# Patient Record
Sex: Female | Born: 1935 | Race: Black or African American | Hispanic: No | Smoking: Former smoker
Health system: Southern US, Community
[De-identification: ages and names within clinical notes are randomized; demographics above are authoritative.]

## PROBLEM LIST (undated history)

## (undated) DIAGNOSIS — E119 Type 2 diabetes mellitus without complications: Secondary | ICD-10-CM

## (undated) HISTORY — PX: JOINT REPLACEMENT: SHX530

---

## 1997-09-19 ENCOUNTER — Inpatient Hospital Stay (HOSPITAL_COMMUNITY)
Admission: RE | Admit: 1997-09-19 | Discharge: 1997-09-26 | Payer: Self-pay | Admitting: Physical Medicine and Rehabilitation

## 2002-05-27 ENCOUNTER — Ambulatory Visit (HOSPITAL_COMMUNITY): Admission: RE | Admit: 2002-05-27 | Discharge: 2002-05-27 | Payer: Self-pay | Admitting: Internal Medicine

## 2002-07-05 ENCOUNTER — Ambulatory Visit (HOSPITAL_COMMUNITY): Admission: RE | Admit: 2002-07-05 | Discharge: 2002-07-05 | Payer: Self-pay | Admitting: Family Medicine

## 2002-07-05 ENCOUNTER — Encounter: Payer: Self-pay | Admitting: Family Medicine

## 2004-01-23 ENCOUNTER — Ambulatory Visit (HOSPITAL_COMMUNITY): Admission: RE | Admit: 2004-01-23 | Discharge: 2004-01-23 | Payer: Self-pay | Admitting: Ophthalmology

## 2004-02-06 ENCOUNTER — Ambulatory Visit (HOSPITAL_COMMUNITY): Admission: RE | Admit: 2004-02-06 | Discharge: 2004-02-06 | Payer: Self-pay | Admitting: Ophthalmology

## 2007-08-02 ENCOUNTER — Emergency Department (HOSPITAL_COMMUNITY): Admission: EM | Admit: 2007-08-02 | Discharge: 2007-08-02 | Payer: Self-pay | Admitting: Emergency Medicine

## 2007-08-03 ENCOUNTER — Ambulatory Visit: Payer: Self-pay | Admitting: Orthopedic Surgery

## 2007-08-03 ENCOUNTER — Ambulatory Visit (HOSPITAL_COMMUNITY): Admission: RE | Admit: 2007-08-03 | Discharge: 2007-08-03 | Payer: Self-pay | Admitting: Orthopedic Surgery

## 2007-08-03 DIAGNOSIS — IMO0002 Reserved for concepts with insufficient information to code with codable children: Secondary | ICD-10-CM | POA: Insufficient documentation

## 2007-08-03 DIAGNOSIS — M25569 Pain in unspecified knee: Secondary | ICD-10-CM

## 2007-08-17 ENCOUNTER — Ambulatory Visit: Payer: Self-pay | Admitting: Orthopedic Surgery

## 2007-08-24 ENCOUNTER — Encounter: Payer: Self-pay | Admitting: Orthopedic Surgery

## 2008-02-28 ENCOUNTER — Ambulatory Visit: Payer: Self-pay | Admitting: Orthopedic Surgery

## 2008-02-28 DIAGNOSIS — M1710 Unilateral primary osteoarthritis, unspecified knee: Secondary | ICD-10-CM

## 2014-02-22 ENCOUNTER — Encounter (HOSPITAL_COMMUNITY): Payer: Self-pay

## 2014-02-22 ENCOUNTER — Emergency Department (HOSPITAL_COMMUNITY): Payer: Medicare Other

## 2014-02-22 ENCOUNTER — Emergency Department (HOSPITAL_COMMUNITY)
Admission: EM | Admit: 2014-02-22 | Discharge: 2014-02-23 | Disposition: A | Discharge status: Home / Self Care | Payer: Medicare Other | Attending: Emergency Medicine | Admitting: Emergency Medicine

## 2014-02-22 DIAGNOSIS — T148 Other injury of unspecified body region: Secondary | ICD-10-CM | POA: Diagnosis not present

## 2014-02-22 DIAGNOSIS — M25559 Pain in unspecified hip: Secondary | ICD-10-CM | POA: Diagnosis not present

## 2014-02-22 DIAGNOSIS — Z87891 Personal history of nicotine dependence: Secondary | ICD-10-CM | POA: Insufficient documentation

## 2014-02-22 DIAGNOSIS — S40212A Abrasion of left shoulder, initial encounter: Secondary | ICD-10-CM | POA: Diagnosis not present

## 2014-02-22 DIAGNOSIS — S6992XA Unspecified injury of left wrist, hand and finger(s), initial encounter: Secondary | ICD-10-CM | POA: Diagnosis not present

## 2014-02-22 DIAGNOSIS — Y998 Other external cause status: Secondary | ICD-10-CM | POA: Insufficient documentation

## 2014-02-22 DIAGNOSIS — Y9389 Activity, other specified: Secondary | ICD-10-CM | POA: Diagnosis not present

## 2014-02-22 DIAGNOSIS — Y92009 Unspecified place in unspecified non-institutional (private) residence as the place of occurrence of the external cause: Secondary | ICD-10-CM | POA: Diagnosis not present

## 2014-02-22 DIAGNOSIS — M79602 Pain in left arm: Secondary | ICD-10-CM | POA: Diagnosis not present

## 2014-02-22 DIAGNOSIS — S59902A Unspecified injury of left elbow, initial encounter: Secondary | ICD-10-CM | POA: Diagnosis not present

## 2014-02-22 DIAGNOSIS — W19XXXA Unspecified fall, initial encounter: Secondary | ICD-10-CM

## 2014-02-22 DIAGNOSIS — Z23 Encounter for immunization: Secondary | ICD-10-CM | POA: Diagnosis not present

## 2014-02-22 DIAGNOSIS — T148XXA Other injury of unspecified body region, initial encounter: Secondary | ICD-10-CM

## 2014-02-22 DIAGNOSIS — S8992XA Unspecified injury of left lower leg, initial encounter: Secondary | ICD-10-CM | POA: Diagnosis not present

## 2014-02-22 DIAGNOSIS — M25532 Pain in left wrist: Secondary | ICD-10-CM | POA: Diagnosis not present

## 2014-02-22 DIAGNOSIS — W1830XA Fall on same level, unspecified, initial encounter: Secondary | ICD-10-CM | POA: Diagnosis not present

## 2014-02-22 DIAGNOSIS — S8001XA Contusion of right knee, initial encounter: Secondary | ICD-10-CM | POA: Diagnosis not present

## 2014-02-22 DIAGNOSIS — Z880 Allergy status to penicillin: Secondary | ICD-10-CM | POA: Insufficient documentation

## 2014-02-22 DIAGNOSIS — M25512 Pain in left shoulder: Secondary | ICD-10-CM | POA: Diagnosis not present

## 2014-02-22 DIAGNOSIS — M25561 Pain in right knee: Secondary | ICD-10-CM | POA: Diagnosis not present

## 2014-02-22 DIAGNOSIS — S8002XA Contusion of left knee, initial encounter: Secondary | ICD-10-CM | POA: Diagnosis not present

## 2014-02-22 DIAGNOSIS — M25562 Pain in left knee: Secondary | ICD-10-CM | POA: Diagnosis not present

## 2014-02-22 DIAGNOSIS — S8991XA Unspecified injury of right lower leg, initial encounter: Secondary | ICD-10-CM | POA: Insufficient documentation

## 2014-02-22 DIAGNOSIS — M25522 Pain in left elbow: Secondary | ICD-10-CM | POA: Diagnosis not present

## 2014-02-22 DIAGNOSIS — S4992XA Unspecified injury of left shoulder and upper arm, initial encounter: Secondary | ICD-10-CM | POA: Diagnosis present

## 2014-02-22 MED ORDER — TRAMADOL HCL 50 MG PO TABS: 25.0000 mg | ORAL_TABLET | Freq: Four times a day (QID) | ORAL | Status: DC | PRN

## 2014-02-22 MED ORDER — TETANUS-DIPHTH-ACELL PERTUSSIS 5-2.5-18.5 LF-MCG/0.5 IM SUSP
0.5000 mL | Freq: Once | INTRAMUSCULAR | Status: AC
  Administered 2014-02-23: 0.5 mL via INTRAMUSCULAR
  Filled 2014-02-22: qty 0.5

## 2014-02-22 NOTE — Discharge Instructions (Signed)

## 2014-02-22 NOTE — ED Provider Notes (Signed)
CSN: [id number]     Arrival date & time 02/22/14  2106 History   This chart was scribed for Gilda Creasehristopher J. Yaresly Menzel, * by [name], ED Scribe. This patient was seen in room APA16A/APA16A and the patient's care was started at 9:59 PM.    Chief Complaint  Patient presents with  . Fall  . Arm Injury   The history is provided by the patient. No language interpreter was used.   HPI Comments: [name] is a 80 y.o. female who brought to the Emergency Department by EMS complaining of a fall that occurred approximately 9 hours ago. Pt states that she slipped and fell while cleaning, but her left got wedged between her bed and another piece of furniture. Pt states that the slipping was caused by the socks that she was wearing. She is currently complaining of left arm pain and bilateral knee pain. Pt also endorses left elbow pain and wrist pain. She denies any LOC, head trauma, head injury, weakness, numbness, or loss of sensation.   History reviewed. No pertinent past medical history. History reviewed. No pertinent past surgical history. No family history on file. History  Substance Use Topics  . Smoking status: Former Games developermoker  . Smokeless tobacco: Not on file  . Alcohol Use: No   OB History    No data available     Review of Systems  Musculoskeletal: Positive for arthralgias.  Skin: Positive for wound.  All other systems reviewed and are negative.     Allergies  Codeine and Penicillins  Home Medications   Prior to Admission medications   Not on File   Triage Vitals: BP 168/101 mmHg  Pulse 91  Resp 16  SpO2 99%  Physical Exam  Constitutional: She is oriented to person, place, and time. She appears well-developed and well-nourished. No distress.  HENT:  Head: Normocephalic and atraumatic.  Right Ear: Hearing normal.  Left Ear: Hearing normal.  Nose: Nose normal.  Mouth/Throat: Oropharynx is clear and moist and mucous membranes are normal.  Eyes: Conjunctivae and  EOM are normal. Pupils are equal, round, and reactive to light.  Neck: Normal range of motion. Neck supple.  Cardiovascular: Regular rhythm, S1 normal and S2 normal.  Exam reveals no gallop and no friction rub.   No murmur heard. Pulmonary/Chest: Effort normal and breath sounds normal. No respiratory distress. She exhibits no tenderness.  Abdominal: Soft. Normal appearance and bowel sounds are normal. There is no hepatosplenomegaly. There is no tenderness. There is no rebound, no guarding, no tenderness at McBurney's point and negative Murphy's sign. No hernia.  Musculoskeletal: Normal range of motion. She exhibits tenderness.  Tender to palpation of bilateral knees. Tenderness to palpation of left shoulder, left elbow, and left wrist.   Neurological: She is alert and oriented to person, place, and time. She has normal strength. No cranial nerve deficit or sensory deficit. Coordination normal. GCS eye subscore is 4. GCS verbal subscore is 5. GCS motor subscore is 6.  Skin: Skin is warm, dry and intact. No rash noted. No cyanosis.  Big abrasion to anterior aspect of the left shoulder.   Psychiatric: She has a normal mood and affect. Her speech is normal and behavior is normal. Thought content normal.  Nursing note and vitals reviewed.   ED Course  Procedures (including critical care time)  DIAGNOSTIC STUDIES: Oxygen Saturation is 98% on RA, normal by my interpretation.    COORDINATION OF CARE: 10:05 PM- Will order X-rays of left arm. Pt  advised of plan for treatment and pt agrees.  Labs Review Labs Reviewed - No data to display  Imaging Review No results found.   EKG Interpretation None      MDM   Final diagnoses:  None  Abrasion Contusion  Patient complaining of bilateral knee pain and left arm pain after a ground-level fall. X-rays are negative. She does have an abrasion on the anterior portion of her shoulder. Family was counseled on wound care and watching for signs of  infection.  Family reports that the left leg has been more swollen than the right for some time. She "does not see doctors often". Patient will reschedule for venous duplex of the leg tomorrow to ensure that there is no DVT. She was provided analgesia.  I personally performed the services described in this documentation, which was scribed in my presence. The recorded information has been reviewed and is accurate.    Gilda Creasehristopher J. Amalea Ottey, MD 02/22/14 2351

## 2014-02-22 NOTE — ED Notes (Signed)
Patient used bedpan not able to save urine due to stool also being present. Patient has a lot of edema in left ankle and calf area. Also patient has several abrasions on her left shoulder. Patient has a place on left outer thigh that looks like a water blister.

## 2014-02-22 NOTE — ED Notes (Signed)
Pt reports to ems that she fell at home approx 12 noon, states she slipped on her slippery socks and then got her arm wedged between the bed and another piece of furniture.  Pt c/o pain to left arm and behind left knee.

## 2014-02-22 NOTE — ED Notes (Addendum)
Abrasion noted to left shoulder, EMS reports that pt lives alone at home

## 2014-02-23 ENCOUNTER — Other Ambulatory Visit (HOSPITAL_COMMUNITY): Payer: Self-pay | Admitting: Emergency Medicine

## 2014-02-23 ENCOUNTER — Ambulatory Visit (HOSPITAL_COMMUNITY)
Admission: RE | Admit: 2014-02-23 | Discharge: 2014-02-23 | Disposition: A | Discharge status: Home / Self Care | Payer: Medicare Other | Source: Ambulatory Visit | Attending: Emergency Medicine | Admitting: Emergency Medicine

## 2014-02-23 DIAGNOSIS — M7989 Other specified soft tissue disorders: Secondary | ICD-10-CM

## 2014-02-23 NOTE — ED Notes (Signed)
Pt left ED via wheelchair by private vehicle with son. No signs of distress. Pt's son and pt verbalizes discharge instructions.

## 2014-02-23 NOTE — ED Notes (Signed)
Family at bedside. Patient taken off bedpan urinated again. Patient peri care done at bedside. Patient able to turn to assist.

## 2014-02-27 DIAGNOSIS — M810 Age-related osteoporosis without current pathological fracture: Secondary | ICD-10-CM | POA: Diagnosis not present

## 2014-02-27 DIAGNOSIS — R296 Repeated falls: Secondary | ICD-10-CM | POA: Diagnosis not present

## 2014-02-27 DIAGNOSIS — E559 Vitamin D deficiency, unspecified: Secondary | ICD-10-CM | POA: Diagnosis not present

## 2014-02-27 DIAGNOSIS — E785 Hyperlipidemia, unspecified: Secondary | ICD-10-CM | POA: Diagnosis not present

## 2014-02-27 DIAGNOSIS — R7301 Impaired fasting glucose: Secondary | ICD-10-CM | POA: Diagnosis not present

## 2014-03-03 DIAGNOSIS — M810 Age-related osteoporosis without current pathological fracture: Secondary | ICD-10-CM | POA: Diagnosis not present

## 2014-03-03 DIAGNOSIS — R7301 Impaired fasting glucose: Secondary | ICD-10-CM | POA: Diagnosis not present

## 2014-03-03 DIAGNOSIS — M179 Osteoarthritis of knee, unspecified: Secondary | ICD-10-CM | POA: Diagnosis not present

## 2014-03-03 DIAGNOSIS — R296 Repeated falls: Secondary | ICD-10-CM | POA: Diagnosis not present

## 2014-03-06 DIAGNOSIS — M810 Age-related osteoporosis without current pathological fracture: Secondary | ICD-10-CM | POA: Diagnosis not present

## 2014-03-06 DIAGNOSIS — M179 Osteoarthritis of knee, unspecified: Secondary | ICD-10-CM | POA: Diagnosis not present

## 2014-03-07 DIAGNOSIS — M810 Age-related osteoporosis without current pathological fracture: Secondary | ICD-10-CM | POA: Diagnosis not present

## 2014-03-07 DIAGNOSIS — M179 Osteoarthritis of knee, unspecified: Secondary | ICD-10-CM | POA: Diagnosis not present

## 2014-03-15 DIAGNOSIS — M810 Age-related osteoporosis without current pathological fracture: Secondary | ICD-10-CM | POA: Diagnosis not present

## 2014-03-15 DIAGNOSIS — M179 Osteoarthritis of knee, unspecified: Secondary | ICD-10-CM | POA: Diagnosis not present

## 2014-03-21 DIAGNOSIS — M810 Age-related osteoporosis without current pathological fracture: Secondary | ICD-10-CM | POA: Diagnosis not present

## 2014-03-21 DIAGNOSIS — M179 Osteoarthritis of knee, unspecified: Secondary | ICD-10-CM | POA: Diagnosis not present

## 2014-03-24 DIAGNOSIS — M810 Age-related osteoporosis without current pathological fracture: Secondary | ICD-10-CM | POA: Diagnosis not present

## 2014-03-24 DIAGNOSIS — M179 Osteoarthritis of knee, unspecified: Secondary | ICD-10-CM | POA: Diagnosis not present

## 2014-03-29 DIAGNOSIS — M179 Osteoarthritis of knee, unspecified: Secondary | ICD-10-CM | POA: Diagnosis not present

## 2014-03-29 DIAGNOSIS — M810 Age-related osteoporosis without current pathological fracture: Secondary | ICD-10-CM | POA: Diagnosis not present

## 2014-04-04 DIAGNOSIS — M810 Age-related osteoporosis without current pathological fracture: Secondary | ICD-10-CM | POA: Diagnosis not present

## 2014-04-04 DIAGNOSIS — M179 Osteoarthritis of knee, unspecified: Secondary | ICD-10-CM | POA: Diagnosis not present

## 2014-05-22 ENCOUNTER — Emergency Department (HOSPITAL_COMMUNITY): Payer: Medicare Other

## 2014-05-22 ENCOUNTER — Inpatient Hospital Stay (HOSPITAL_COMMUNITY)
Admission: EM | Admit: 2014-05-22 | Discharge: 2014-05-27 | DRG: 481 | Disposition: A | Discharge status: Skilled Nursing Facility | Payer: Medicare Other | Attending: Internal Medicine | Admitting: Internal Medicine

## 2014-05-22 ENCOUNTER — Encounter (HOSPITAL_COMMUNITY): Payer: Self-pay | Admitting: Emergency Medicine

## 2014-05-22 DIAGNOSIS — R7309 Other abnormal glucose: Secondary | ICD-10-CM | POA: Diagnosis present

## 2014-05-22 DIAGNOSIS — T148XXA Other injury of unspecified body region, initial encounter: Secondary | ICD-10-CM

## 2014-05-22 DIAGNOSIS — Z09 Encounter for follow-up examination after completed treatment for conditions other than malignant neoplasm: Secondary | ICD-10-CM

## 2014-05-22 DIAGNOSIS — S7292XA Unspecified fracture of left femur, initial encounter for closed fracture: Secondary | ICD-10-CM

## 2014-05-22 DIAGNOSIS — M79605 Pain in left leg: Secondary | ICD-10-CM | POA: Diagnosis not present

## 2014-05-22 DIAGNOSIS — S72402A Unspecified fracture of lower end of left femur, initial encounter for closed fracture: Secondary | ICD-10-CM | POA: Diagnosis not present

## 2014-05-22 DIAGNOSIS — W19XXXA Unspecified fall, initial encounter: Secondary | ICD-10-CM

## 2014-05-22 DIAGNOSIS — M978XXA Periprosthetic fracture around other internal prosthetic joint, initial encounter: Secondary | ICD-10-CM

## 2014-05-22 DIAGNOSIS — R627 Adult failure to thrive: Secondary | ICD-10-CM | POA: Diagnosis present

## 2014-05-22 DIAGNOSIS — N390 Urinary tract infection, site not specified: Secondary | ICD-10-CM | POA: Diagnosis not present

## 2014-05-22 DIAGNOSIS — Z419 Encounter for procedure for purposes other than remedying health state, unspecified: Secondary | ICD-10-CM

## 2014-05-22 DIAGNOSIS — T84041A Periprosthetic fracture around internal prosthetic left hip joint, initial encounter: Principal | ICD-10-CM | POA: Diagnosis present

## 2014-05-22 DIAGNOSIS — F0390 Unspecified dementia without behavioral disturbance: Secondary | ICD-10-CM | POA: Diagnosis not present

## 2014-05-22 DIAGNOSIS — M25552 Pain in left hip: Secondary | ICD-10-CM | POA: Diagnosis not present

## 2014-05-22 DIAGNOSIS — Z96641 Presence of right artificial hip joint: Secondary | ICD-10-CM | POA: Diagnosis not present

## 2014-05-22 DIAGNOSIS — Z01811 Encounter for preprocedural respiratory examination: Secondary | ICD-10-CM

## 2014-05-22 DIAGNOSIS — I959 Hypotension, unspecified: Secondary | ICD-10-CM | POA: Diagnosis not present

## 2014-05-22 DIAGNOSIS — R000 Tachycardia, unspecified: Secondary | ICD-10-CM | POA: Diagnosis not present

## 2014-05-22 DIAGNOSIS — S79912A Unspecified injury of left hip, initial encounter: Secondary | ICD-10-CM | POA: Diagnosis not present

## 2014-05-22 DIAGNOSIS — Y92009 Unspecified place in unspecified non-institutional (private) residence as the place of occurrence of the external cause: Secondary | ICD-10-CM

## 2014-05-22 DIAGNOSIS — Z87891 Personal history of nicotine dependence: Secondary | ICD-10-CM

## 2014-05-22 DIAGNOSIS — D62 Acute posthemorrhagic anemia: Secondary | ICD-10-CM | POA: Diagnosis not present

## 2014-05-22 DIAGNOSIS — S72492A Other fracture of lower end of left femur, initial encounter for closed fracture: Secondary | ICD-10-CM | POA: Diagnosis not present

## 2014-05-22 DIAGNOSIS — W010XXA Fall on same level from slipping, tripping and stumbling without subsequent striking against object, initial encounter: Secondary | ICD-10-CM | POA: Diagnosis present

## 2014-05-22 DIAGNOSIS — Z96649 Presence of unspecified artificial hip joint: Secondary | ICD-10-CM

## 2014-05-22 DIAGNOSIS — J90 Pleural effusion, not elsewhere classified: Secondary | ICD-10-CM | POA: Diagnosis not present

## 2014-05-22 DIAGNOSIS — R52 Pain, unspecified: Secondary | ICD-10-CM | POA: Diagnosis not present

## 2014-05-22 DIAGNOSIS — R011 Cardiac murmur, unspecified: Secondary | ICD-10-CM | POA: Insufficient documentation

## 2014-05-22 DIAGNOSIS — Z96642 Presence of left artificial hip joint: Secondary | ICD-10-CM | POA: Diagnosis not present

## 2014-05-22 LAB — CBC WITH DIFFERENTIAL/PLATELET
BASOS ABS: 0 10*3/uL (ref 0.0–0.1)
BASOS PCT: 0 % (ref 0–1)
EOS PCT: 0 % (ref 0–5)
Eosinophils Absolute: 0 10*3/uL (ref 0.0–0.7)
HCT: 36.3 % (ref 36.0–46.0)
Hemoglobin: 12.2 g/dL (ref 12.0–15.0)
LYMPHS PCT: 10 % — AB (ref 12–46)
Lymphs Abs: 1.1 10*3/uL (ref 0.7–4.0)
MCH: 30.6 pg (ref 26.0–34.0)
MCHC: 33.6 g/dL (ref 30.0–36.0)
MCV: 91 fL (ref 78.0–100.0)
MONO ABS: 1.2 10*3/uL — AB (ref 0.1–1.0)
Monocytes Relative: 11 % (ref 3–12)
NEUTROS ABS: 8.1 10*3/uL — AB (ref 1.7–7.7)
Neutrophils Relative %: 79 % — ABNORMAL HIGH (ref 43–77)
PLATELETS: 206 10*3/uL (ref 150–400)
RBC: 3.99 MIL/uL (ref 3.87–5.11)
RDW: 13.9 % (ref 11.5–15.5)
WBC: 10.4 10*3/uL (ref 4.0–10.5)

## 2014-05-22 LAB — BASIC METABOLIC PANEL
ANION GAP: 13 (ref 5–15)
BUN: 24 mg/dL — ABNORMAL HIGH (ref 6–23)
CHLORIDE: 102 mmol/L (ref 96–112)
CO2: 22 mmol/L (ref 19–32)
Calcium: 9.5 mg/dL (ref 8.4–10.5)
Creatinine, Ser: 1.08 mg/dL (ref 0.50–1.10)
GFR calc non Af Amer: 45 mL/min — ABNORMAL LOW (ref >90)
GFR, EST AFRICAN AMERICAN: 52 mL/min — AB (ref >90)
Glucose, Bld: 111 mg/dL — ABNORMAL HIGH (ref 70–99)
POTASSIUM: 3.6 mmol/L (ref 3.5–5.1)
SODIUM: 137 mmol/L (ref 135–145)

## 2014-05-22 NOTE — ED Notes (Signed)
Pt transported to xray 

## 2014-05-22 NOTE — ED Notes (Signed)
Pt tripped at home and fell onto left leg. Pt c/o of pain in upper left leg, leg is internally rotated. Ems gave 5mg  of morphine and 4mg  of zofran.

## 2014-05-22 NOTE — ED Provider Notes (Addendum)
CSN: [id number]     Arrival date & time 05/22/14  1930 History   First MD Initiated Contact with Patient 05/22/14 1953     Chief Complaint  Patient presents with  . Fall     (Consider location/radiation/quality/duration/timing/severity/associated sxs/prior Treatment) Patient is a 81 y.o. female presenting with fall. The history is provided by the patient.  Fall  She was walking from her bedroom to the kitchen and states that her left leg went out on her and she fell. She's not been able to get up since then. She is complaining of pain which he rates at 10/10. She is status post hip surgery in that leg approximately 17 years ago. She denies other injury.  History reviewed. No pertinent past medical history. Past Surgical History  Procedure Laterality Date  . Joint replacement     No family history on file. History  Substance Use Topics  . Smoking status: Former Games developer  . Smokeless tobacco: Not on file  . Alcohol Use: No   OB History    No data available     Review of Systems  All other systems reviewed and are negative.     Allergies  Codeine and Penicillins  Home Medications   Prior to Admission medications   Medication Sig Start Date End Date Taking? Authorizing Provider  acetaminophen (TYLENOL) 500 MG tablet Take 500 mg by mouth every 6 (six) hours as needed for mild pain.   Yes Historical Provider, MD  Misc Natural Products (FLEX-A-MIN JOINT FLEX) TABS Take 1 tablet by mouth daily.   Yes Historical Provider, MD  Turmeric Curcumin 500 MG CAPS Take 1 capsule by mouth daily as needed (for pain).   Yes Historical Provider, MD  traMADol (ULTRAM) 50 MG tablet Take 0.5 tablets (25 mg total) by mouth every 6 (six) hours as needed. Patient not taking: Reported on 05/22/2014 02/22/14   Gilda Crease, MD   BP 142/78 mmHg  Pulse 103  Temp(Src) 97.6 F (36.4 C) (Oral)  Resp 16  Ht  (1.6 m)  Wt 140 lb (63.504 kg)  BMI 24.81 kg/m2  SpO2 100% Physical Exam   Nursing note and vitals reviewed.  82 year old female, resting comfortably and in no acute distress. Vital signs are significant for mild hypertension and mild tachycardia. Oxygen saturation is 100%, which is normal. Head is normocephalic and atraumatic. PERRLA, EOMI. Oropharynx is clear. Neck is nontender and supple without adenopathy or JVD. Back is nontender and there is no CVA tenderness. Lungs are clear without rales, wheezes, or rhonchi. Chest is nontender. Heart has regular rate and rhythm without murmur. Abdomen is soft, flat, nontender without masses or hepatosplenomegaly and peristalsis is normoactive. Extremities: Left leg is shortened and internally rotated consistent with hip dislocation. Dorsalis pedis pulse is strong and capillary refill is prompt and sensation is normal.. Skin is warm and dry without rash. Neurologic: Mental status is normal, cranial nerves are intact, there are no motor or sensory deficits.  ED Course  Procedures (including critical care time) Labs Review Results for orders placed or performed during the hospital encounter of 05/22/14  Basic metabolic panel  Result Value Ref Range   Sodium 137 135 - 145 mmol/L   Potassium 3.6 3.5 - 5.1 mmol/L   Chloride 102 96 - 112 mmol/L   CO2 22 19 - 32 mmol/L   Glucose, Bld 111 (H) 70 - 99 mg/dL   BUN 24 (H) 6 - 23 mg/dL   Creatinine, Ser 7.82  0.50 - 1.10 mg/dL   Calcium 9.5 8.4 - 63.8 mg/dL   GFR calc non Af Amer 45 (L) >90 mL/min   GFR calc Af Amer 52 (L) >90 mL/min   Anion gap 13 5 - 15  CBC with Differential  Result Value Ref Range   WBC 10.4 4.0 - 10.5 K/uL   RBC 3.99 3.87 - 5.11 MIL/uL   Hemoglobin 12.2 12.0 - 15.0 g/dL   HCT 75.6 43.3 - 29.5 %   MCV 91.0 78.0 - 100.0 fL   MCH 30.6 26.0 - 34.0 pg   MCHC 33.6 30.0 - 36.0 g/dL   RDW 18.8 41.6 - 60.6 %   Platelets 206 150 - 400 K/uL   Neutrophils Relative % 79 (H) 43 - 77 %   Neutro Abs 8.1 (H) 1.7 - 7.7 K/uL   Lymphocytes Relative 10 (L) 12 - 46  %   Lymphs Abs 1.1 0.7 - 4.0 K/uL   Monocytes Relative 11 3 - 12 %   Monocytes Absolute 1.2 (H) 0.1 - 1.0 K/uL   Eosinophils Relative 0 0 - 5 %   Eosinophils Absolute 0.0 0.0 - 0.7 K/uL   Basophils Relative 0 0 - 1 %   Basophils Absolute 0.0 0.0 - 0.1 K/uL   Imaging Review Dg Chest 1 View  05/22/2014   CLINICAL DATA:  Status post fall.  Left hip pain.  EXAM: CHEST  1 VIEW  COMPARISON:  None.  FINDINGS: There is elevation of the right hemidiaphragm relative to the left. The patient is rotated on the study. Lungs appear clear. Heart size is normal. No pneumothorax or pleural effusion. Advanced degenerative disease right shoulder is noted.  IMPRESSION: No acute cardiopulmonary disease.   Electronically Signed   By: Drusilla Kanner M.D.   On: 05/22/2014 20:42   Dg Hip Unilat With Pelvis 2-3 Views Left  05/22/2014   CLINICAL DATA:  Status post fall with pain all lower left hip.  EXAM: LEFT HIP (WITH PELVIS) 2-3 VIEWS  COMPARISON:  None.  FINDINGS: A left hip replacement is identified. There is no dislocation. There is no acute fracture or dislocation. There are severe it degenerative joint changes of right hip.  IMPRESSION: Left hip replacement without dislocation. No acute fracture or dislocation is identified.   Electronically Signed   By: Sherian Rein M.D.   On: 05/22/2014 20:42   Images viewed by me.   EKG Interpretation   Date/Time:  Monday May 22 2014 20:51:49 EST Ventricular Rate:  106 PR Interval:  180 QRS Duration: 52 QT Interval:  349 QTC Calculation: 463 R Axis:   41 Text Interpretation:  Sinus tachycardia LAE, consider biatrial enlargement  Artifact in lead(s) I II III aVR aVL aVF V2 V5 and baseline wander in  lead(s) II III aVR aVL aVF V1 V2 V3 V4 V5 V6 No old tracing to compare  Confirmed by [facility]  MD, Deniz Hannan ([id number]) on 05/22/2014 9:07:20 PM      MDM   Final diagnoses:  Fall at home  Fall at home, initial encounter  Closed fracture of left femur, initial encounter     Fall with probable left hip dislocation. X-rays ordered.  X-rays do not show evidence of hip dislocation. Patient was examined in greater detail and she is tender in the region of her left knee and distal left thigh. I am asked she able to put her knee and hip through a full range of motion although it does seem to be painful.  Femur x-ray and the x-ray have been ordered.  Femur x-ray shows oblique fracture of the distal femur without any intra-articular component. Cases been discussed with Dr. Shon BatonBrooks of orthopedics who requests medicine admit the patient and he will arrange for appropriate surgical management. Case is discussed with Dr. Welton FlakesKhan of [facility] who agrees to admit the patient.  Dione Boozeavid Chante Mayson, MD 05/23/14 0006  Dione Boozeavid Ainsleigh Kakos, MD 05/23/14 [id number]

## 2014-05-22 NOTE — ED Notes (Signed)
Family at bedside. 

## 2014-05-23 ENCOUNTER — Inpatient Hospital Stay (HOSPITAL_COMMUNITY): Payer: Medicare Other | Admitting: Certified Registered"

## 2014-05-23 ENCOUNTER — Inpatient Hospital Stay (HOSPITAL_COMMUNITY): Payer: Medicare Other

## 2014-05-23 ENCOUNTER — Encounter (HOSPITAL_COMMUNITY)
Admission: EM | Disposition: A | Discharge status: Skilled Nursing Facility | Payer: Self-pay | Source: Home / Self Care | Attending: Internal Medicine

## 2014-05-23 DIAGNOSIS — S7292XD Unspecified fracture of left femur, subsequent encounter for closed fracture with routine healing: Secondary | ICD-10-CM | POA: Diagnosis not present

## 2014-05-23 DIAGNOSIS — M79605 Pain in left leg: Secondary | ICD-10-CM | POA: Diagnosis not present

## 2014-05-23 DIAGNOSIS — Y92009 Unspecified place in unspecified non-institutional (private) residence as the place of occurrence of the external cause: Secondary | ICD-10-CM

## 2014-05-23 DIAGNOSIS — Z87891 Personal history of nicotine dependence: Secondary | ICD-10-CM | POA: Diagnosis not present

## 2014-05-23 DIAGNOSIS — S72402D Unspecified fracture of lower end of left femur, subsequent encounter for closed fracture with routine healing: Secondary | ICD-10-CM | POA: Diagnosis not present

## 2014-05-23 DIAGNOSIS — R488 Other symbolic dysfunctions: Secondary | ICD-10-CM | POA: Diagnosis not present

## 2014-05-23 DIAGNOSIS — R279 Unspecified lack of coordination: Secondary | ICD-10-CM | POA: Diagnosis not present

## 2014-05-23 DIAGNOSIS — W19XXXD Unspecified fall, subsequent encounter: Secondary | ICD-10-CM | POA: Diagnosis not present

## 2014-05-23 DIAGNOSIS — S7292XA Unspecified fracture of left femur, initial encounter for closed fracture: Secondary | ICD-10-CM

## 2014-05-23 DIAGNOSIS — S72492D Other fracture of lower end of left femur, subsequent encounter for closed fracture with routine healing: Secondary | ICD-10-CM | POA: Diagnosis not present

## 2014-05-23 DIAGNOSIS — T84043D Periprosthetic fracture around internal prosthetic left knee joint, subsequent encounter: Secondary | ICD-10-CM | POA: Diagnosis not present

## 2014-05-23 DIAGNOSIS — R531 Weakness: Secondary | ICD-10-CM | POA: Diagnosis not present

## 2014-05-23 DIAGNOSIS — R010 Benign and innocent cardiac murmurs: Secondary | ICD-10-CM | POA: Diagnosis not present

## 2014-05-23 DIAGNOSIS — T84041A Periprosthetic fracture around internal prosthetic left hip joint, initial encounter: Secondary | ICD-10-CM | POA: Diagnosis not present

## 2014-05-23 DIAGNOSIS — Y92099 Unspecified place in other non-institutional residence as the place of occurrence of the external cause: Secondary | ICD-10-CM | POA: Diagnosis not present

## 2014-05-23 DIAGNOSIS — M0549 Rheumatoid myopathy with rheumatoid arthritis of multiple sites: Secondary | ICD-10-CM | POA: Diagnosis not present

## 2014-05-23 DIAGNOSIS — Z96642 Presence of left artificial hip joint: Secondary | ICD-10-CM | POA: Diagnosis not present

## 2014-05-23 DIAGNOSIS — S72422A Displaced fracture of lateral condyle of left femur, initial encounter for closed fracture: Secondary | ICD-10-CM | POA: Diagnosis not present

## 2014-05-23 DIAGNOSIS — D62 Acute posthemorrhagic anemia: Secondary | ICD-10-CM | POA: Diagnosis not present

## 2014-05-23 DIAGNOSIS — R000 Tachycardia, unspecified: Secondary | ICD-10-CM | POA: Diagnosis not present

## 2014-05-23 DIAGNOSIS — R011 Cardiac murmur, unspecified: Secondary | ICD-10-CM | POA: Insufficient documentation

## 2014-05-23 DIAGNOSIS — W010XXA Fall on same level from slipping, tripping and stumbling without subsequent striking against object, initial encounter: Secondary | ICD-10-CM | POA: Diagnosis present

## 2014-05-23 DIAGNOSIS — T84048A Periprosthetic fracture around other internal prosthetic joint, initial encounter: Secondary | ICD-10-CM | POA: Diagnosis not present

## 2014-05-23 DIAGNOSIS — T84011A Broken internal left hip prosthesis, initial encounter: Secondary | ICD-10-CM | POA: Diagnosis not present

## 2014-05-23 DIAGNOSIS — F0390 Unspecified dementia without behavioral disturbance: Secondary | ICD-10-CM | POA: Diagnosis present

## 2014-05-23 DIAGNOSIS — Z9181 History of falling: Secondary | ICD-10-CM | POA: Diagnosis not present

## 2014-05-23 DIAGNOSIS — M978XXA Periprosthetic fracture around other internal prosthetic joint, initial encounter: Secondary | ICD-10-CM

## 2014-05-23 DIAGNOSIS — R627 Adult failure to thrive: Secondary | ICD-10-CM | POA: Diagnosis present

## 2014-05-23 DIAGNOSIS — S7290XA Unspecified fracture of unspecified femur, initial encounter for closed fracture: Secondary | ICD-10-CM | POA: Diagnosis not present

## 2014-05-23 DIAGNOSIS — W19XXXA Unspecified fall, initial encounter: Secondary | ICD-10-CM | POA: Insufficient documentation

## 2014-05-23 DIAGNOSIS — M1990 Unspecified osteoarthritis, unspecified site: Secondary | ICD-10-CM | POA: Diagnosis not present

## 2014-05-23 DIAGNOSIS — I959 Hypotension, unspecified: Secondary | ICD-10-CM | POA: Diagnosis not present

## 2014-05-23 DIAGNOSIS — R7309 Other abnormal glucose: Secondary | ICD-10-CM | POA: Diagnosis present

## 2014-05-23 DIAGNOSIS — Z96641 Presence of right artificial hip joint: Secondary | ICD-10-CM | POA: Diagnosis not present

## 2014-05-23 DIAGNOSIS — R1312 Dysphagia, oropharyngeal phase: Secondary | ICD-10-CM | POA: Diagnosis not present

## 2014-05-23 DIAGNOSIS — N390 Urinary tract infection, site not specified: Secondary | ICD-10-CM | POA: Diagnosis present

## 2014-05-23 DIAGNOSIS — Z96649 Presence of unspecified artificial hip joint: Secondary | ICD-10-CM

## 2014-05-23 DIAGNOSIS — M6281 Muscle weakness (generalized): Secondary | ICD-10-CM | POA: Diagnosis not present

## 2014-05-23 HISTORY — PX: ORIF PERIPROSTHETIC FRACTURE: SHX5034

## 2014-05-23 LAB — URINALYSIS, ROUTINE W REFLEX MICROSCOPIC
Bilirubin Urine: NEGATIVE
GLUCOSE, UA: NEGATIVE mg/dL
Ketones, ur: 15 mg/dL — AB
Nitrite: NEGATIVE
Protein, ur: NEGATIVE mg/dL
SPECIFIC GRAVITY, URINE: 1.019 (ref 1.005–1.030)
Urobilinogen, UA: 1 mg/dL (ref 0.0–1.0)
pH: 5 (ref 5.0–8.0)

## 2014-05-23 LAB — URINE MICROSCOPIC-ADD ON

## 2014-05-23 LAB — GLUCOSE, CAPILLARY
GLUCOSE-CAPILLARY: 115 mg/dL — AB (ref 70–99)
GLUCOSE-CAPILLARY: 109 mg/dL — AB (ref 70–99)
Glucose-Capillary: 88 mg/dL (ref 70–99)
Glucose-Capillary: 107 mg/dL — ABNORMAL HIGH (ref 70–99)
Glucose-Capillary: 146 mg/dL — ABNORMAL HIGH (ref 70–99)

## 2014-05-23 LAB — CBC
HEMATOCRIT: 26.4 % — AB (ref 36.0–46.0)
Hemoglobin: 8.7 g/dL — ABNORMAL LOW (ref 12.0–15.0)
MCH: 30.1 pg (ref 26.0–34.0)
MCHC: 33 g/dL (ref 30.0–36.0)
MCV: 91.3 fL (ref 78.0–100.0)
Platelets: 156 10*3/uL (ref 150–400)
RBC: 2.89 MIL/uL — ABNORMAL LOW (ref 3.87–5.11)
RDW: 14.1 % (ref 11.5–15.5)
WBC: 8 10*3/uL (ref 4.0–10.5)

## 2014-05-23 LAB — ABO/RH: ABO/RH(D): O POS

## 2014-05-23 LAB — SURGICAL PCR SCREEN
MRSA, PCR: NEGATIVE
Staphylococcus aureus: NEGATIVE

## 2014-05-23 LAB — CREATININE, SERUM
Creatinine, Ser: 0.71 mg/dL (ref 0.50–1.10)
GFR calc non Af Amer: 76 mL/min — ABNORMAL LOW (ref >90)
GFR, EST AFRICAN AMERICAN: 88 mL/min — AB (ref >90)

## 2014-05-23 SURGERY — OPEN REDUCTION INTERNAL FIXATION (ORIF) PERIPROSTHETIC FRACTURE
Anesthesia: General | Site: Hip | Laterality: Left

## 2014-05-23 MED ORDER — TRAMADOL HCL 50 MG PO TABS
25.0000 mg | ORAL_TABLET | Freq: Four times a day (QID) | ORAL | Status: DC | PRN
  Administered 2014-05-24 – 2014-05-25 (×2): 25 mg via ORAL
  Filled 2014-05-23 (×3): qty 1

## 2014-05-23 MED ORDER — PROPOFOL 10 MG/ML IV BOLUS
INTRAVENOUS | Status: AC
  Filled 2014-05-23: qty 20

## 2014-05-23 MED ORDER — METOCLOPRAMIDE HCL 5 MG/ML IJ SOLN: 5.0000 mg | Freq: Three times a day (TID) | INTRAMUSCULAR | Status: DC | PRN

## 2014-05-23 MED ORDER — MORPHINE SULFATE 2 MG/ML IJ SOLN
0.5000 mg | INTRAMUSCULAR | Status: DC | PRN
  Administered 2014-05-23 (×2): 0.5 mg via INTRAVENOUS
  Filled 2014-05-23 (×2): qty 1

## 2014-05-23 MED ORDER — ONDANSETRON HCL 4 MG/2ML IJ SOLN: 4.0000 mg | Freq: Four times a day (QID) | INTRAMUSCULAR | Status: DC | PRN

## 2014-05-23 MED ORDER — PHENYLEPHRINE HCL 10 MG/ML IJ SOLN
10.0000 mg | INTRAVENOUS | Status: DC | PRN
  Administered 2014-05-23: 10 ug/min via INTRAVENOUS

## 2014-05-23 MED ORDER — LACTATED RINGERS IV SOLN
INTRAVENOUS | Status: DC | PRN
  Administered 2014-05-23 (×2): via INTRAVENOUS

## 2014-05-23 MED ORDER — MAGNESIUM CITRATE PO SOLN: 1.0000 | Freq: Once | ORAL | Status: AC | PRN

## 2014-05-23 MED ORDER — DEXTROSE 5 % IV SOLN
500.0000 mg | Freq: Four times a day (QID) | INTRAVENOUS | Status: DC | PRN
  Filled 2014-05-23: qty 5

## 2014-05-23 MED ORDER — ONDANSETRON HCL 4 MG/2ML IJ SOLN
INTRAMUSCULAR | Status: DC | PRN
  Administered 2014-05-23: 4 mg via INTRAVENOUS

## 2014-05-23 MED ORDER — METHOCARBAMOL 500 MG PO TABS
500.0000 mg | ORAL_TABLET | Freq: Four times a day (QID) | ORAL | Status: DC | PRN
  Administered 2014-05-24: 500 mg via ORAL
  Filled 2014-05-23: qty 1

## 2014-05-23 MED ORDER — ACETAMINOPHEN 500 MG PO TABS
1000.0000 mg | ORAL_TABLET | Freq: Three times a day (TID) | ORAL | Status: DC
  Administered 2014-05-23 – 2014-05-27 (×12): 1000 mg via ORAL
  Filled 2014-05-23 (×12): qty 2

## 2014-05-23 MED ORDER — SUCCINYLCHOLINE CHLORIDE 20 MG/ML IJ SOLN
INTRAMUSCULAR | Status: AC
  Filled 2014-05-23: qty 1

## 2014-05-23 MED ORDER — DOCUSATE SODIUM 100 MG PO CAPS
100.0000 mg | ORAL_CAPSULE | Freq: Two times a day (BID) | ORAL | Status: DC
  Administered 2014-05-23 – 2014-05-27 (×9): 100 mg via ORAL
  Filled 2014-05-23 (×9): qty 1

## 2014-05-23 MED ORDER — ROCURONIUM BROMIDE 50 MG/5ML IV SOLN
INTRAVENOUS | Status: AC
  Filled 2014-05-23: qty 1

## 2014-05-23 MED ORDER — SODIUM CHLORIDE 0.9 % IV BOLUS (SEPSIS)
500.0000 mL | Freq: Once | INTRAVENOUS | Status: AC
  Administered 2014-05-23: 500 mL via INTRAVENOUS

## 2014-05-23 MED ORDER — PHENYLEPHRINE 40 MCG/ML (10ML) SYRINGE FOR IV PUSH (FOR BLOOD PRESSURE SUPPORT)
PREFILLED_SYRINGE | INTRAVENOUS | Status: AC
  Filled 2014-05-23: qty 10

## 2014-05-23 MED ORDER — ONDANSETRON HCL 4 MG/2ML IJ SOLN
4.0000 mg | Freq: Four times a day (QID) | INTRAMUSCULAR | Status: DC | PRN
  Administered 2014-05-23: 4 mg via INTRAVENOUS
  Filled 2014-05-23: qty 2

## 2014-05-23 MED ORDER — ENOXAPARIN SODIUM 40 MG/0.4ML ~~LOC~~ SOLN
40.0000 mg | SUBCUTANEOUS | Status: DC
  Administered 2014-05-24 – 2014-05-27 (×4): 40 mg via SUBCUTANEOUS
  Filled 2014-05-23 (×4): qty 0.4

## 2014-05-23 MED ORDER — MENTHOL 3 MG MT LOZG
1.0000 | LOZENGE | OROMUCOSAL | Status: DC | PRN
  Filled 2014-05-23 (×3): qty 9

## 2014-05-23 MED ORDER — LIDOCAINE HCL (CARDIAC) 20 MG/ML IV SOLN
INTRAVENOUS | Status: AC
  Filled 2014-05-23: qty 5

## 2014-05-23 MED ORDER — BISACODYL 10 MG RE SUPP
10.0000 mg | Freq: Every day | RECTAL | Status: DC | PRN
  Administered 2014-05-26: 10 mg via RECTAL
  Filled 2014-05-23: qty 1

## 2014-05-23 MED ORDER — FENTANYL CITRATE 0.05 MG/ML IJ SOLN: 25.0000 ug | INTRAMUSCULAR | Status: DC | PRN

## 2014-05-23 MED ORDER — NEOSTIGMINE METHYLSULFATE 10 MG/10ML IV SOLN
INTRAVENOUS | Status: DC | PRN
  Administered 2014-05-23: 4 mg via INTRAVENOUS

## 2014-05-23 MED ORDER — ALBUMIN HUMAN 5 % IV SOLN
INTRAVENOUS | Status: DC | PRN
  Administered 2014-05-23 (×2): via INTRAVENOUS

## 2014-05-23 MED ORDER — ACETAMINOPHEN 650 MG RE SUPP
650.0000 mg | Freq: Three times a day (TID) | RECTAL | Status: DC
Start: 2014-05-23 — End: 2014-05-27

## 2014-05-23 MED ORDER — PROPOFOL 10 MG/ML IV BOLUS
INTRAVENOUS | Status: DC | PRN
  Administered 2014-05-23: 100 mg via INTRAVENOUS

## 2014-05-23 MED ORDER — HYDROCODONE-ACETAMINOPHEN 5-325 MG PO TABS
1.0000 | ORAL_TABLET | Freq: Four times a day (QID) | ORAL | Status: DC | PRN
Start: 2014-05-23 — End: 2014-05-23
  Administered 2014-05-23: 1 via ORAL
  Filled 2014-05-23: qty 1

## 2014-05-23 MED ORDER — GLYCOPYRROLATE 0.2 MG/ML IJ SOLN
INTRAMUSCULAR | Status: DC | PRN
  Administered 2014-05-23: 0.6 mg via INTRAVENOUS

## 2014-05-23 MED ORDER — BOOST / RESOURCE BREEZE PO LIQD
1.0000 | Freq: Three times a day (TID) | ORAL | Status: DC
  Administered 2014-05-23 – 2014-05-25 (×5): 1 via ORAL

## 2014-05-23 MED ORDER — PHENOL 1.4 % MT LIQD
1.0000 | OROMUCOSAL | Status: DC | PRN
  Filled 2014-05-23: qty 177

## 2014-05-23 MED ORDER — METOCLOPRAMIDE HCL 10 MG PO TABS: 5.0000 mg | ORAL_TABLET | Freq: Three times a day (TID) | ORAL | Status: DC | PRN

## 2014-05-23 MED ORDER — LIDOCAINE HCL (CARDIAC) 20 MG/ML IV SOLN
INTRAVENOUS | Status: DC | PRN
  Administered 2014-05-23: 60 mg via INTRAVENOUS

## 2014-05-23 MED ORDER — SENNA 8.6 MG PO TABS
1.0000 | ORAL_TABLET | Freq: Two times a day (BID) | ORAL | Status: DC
  Administered 2014-05-23 – 2014-05-27 (×8): 8.6 mg via ORAL
  Filled 2014-05-23 (×8): qty 1

## 2014-05-23 MED ORDER — ONDANSETRON HCL 4 MG/2ML IJ SOLN
INTRAMUSCULAR | Status: AC
  Filled 2014-05-23: qty 2

## 2014-05-23 MED ORDER — INSULIN ASPART 100 UNIT/ML ~~LOC~~ SOLN
0.0000 [IU] | Freq: Three times a day (TID) | SUBCUTANEOUS | Status: DC
  Administered 2014-05-24 (×2): 2 [IU] via SUBCUTANEOUS
  Administered 2014-05-24: 3 [IU] via SUBCUTANEOUS
  Administered 2014-05-25 – 2014-05-26 (×2): 2 [IU] via SUBCUTANEOUS

## 2014-05-23 MED ORDER — CEFAZOLIN SODIUM-DEXTROSE 2-3 GM-% IV SOLR
2.0000 g | Freq: Once | INTRAVENOUS | Status: AC
  Administered 2014-05-23: 2 g via INTRAVENOUS

## 2014-05-23 MED ORDER — SODIUM CHLORIDE 0.9 % IV SOLN
INTRAVENOUS | Status: DC
  Administered 2014-05-23 (×2): via INTRAVENOUS

## 2014-05-23 MED ORDER — ONDANSETRON HCL 4 MG PO TABS: 4.0000 mg | ORAL_TABLET | Freq: Four times a day (QID) | ORAL | Status: DC | PRN

## 2014-05-23 MED ORDER — NEOSTIGMINE METHYLSULFATE 10 MG/10ML IV SOLN
INTRAVENOUS | Status: AC
  Filled 2014-05-23: qty 1

## 2014-05-23 MED ORDER — CEFAZOLIN SODIUM-DEXTROSE 2-3 GM-% IV SOLR
INTRAVENOUS | Status: AC
  Filled 2014-05-23: qty 50

## 2014-05-23 MED ORDER — SODIUM CHLORIDE 0.9 % IR SOLN
Status: DC | PRN
Start: 2014-05-23 — End: 2014-05-23
  Administered 2014-05-23: 3000 mL

## 2014-05-23 MED ORDER — GLYCOPYRROLATE 0.2 MG/ML IJ SOLN
INTRAMUSCULAR | Status: AC
  Filled 2014-05-23: qty 3

## 2014-05-23 MED ORDER — FENTANYL CITRATE 0.05 MG/ML IJ SOLN
INTRAMUSCULAR | Status: AC
  Filled 2014-05-23: qty 5

## 2014-05-23 MED ORDER — ROCURONIUM BROMIDE 100 MG/10ML IV SOLN
INTRAVENOUS | Status: DC | PRN
  Administered 2014-05-23 (×2): 10 mg via INTRAVENOUS
  Administered 2014-05-23: 40 mg via INTRAVENOUS

## 2014-05-23 MED ORDER — FENTANYL CITRATE 0.05 MG/ML IJ SOLN
INTRAMUSCULAR | Status: DC | PRN
  Administered 2014-05-23 (×4): 25 ug via INTRAVENOUS
  Administered 2014-05-23: 50 ug via INTRAVENOUS
  Administered 2014-05-23: 25 ug via INTRAVENOUS
  Administered 2014-05-23: 50 ug via INTRAVENOUS
  Administered 2014-05-23: 25 ug via INTRAVENOUS

## 2014-05-23 SURGICAL SUPPLY — 83 items
BIT DRILL 2.5 NCB (BIT) ×1 IMPLANT
BIT DRILL 2.5MM NCB (BIT) ×1
BIT DRILL 4.3 (BIT) IMPLANT
BIT DRILL QC 3.3X195 (BIT) ×2 IMPLANT
CABLE (Orthopedic Implant) ×4 IMPLANT
CAP LOCK NCB (Cap) ×12 IMPLANT
CHLORAPREP W/TINT 26ML (MISCELLANEOUS) ×4 IMPLANT
COVER SURGICAL LIGHT HANDLE (MISCELLANEOUS) ×4 IMPLANT
DRAPE C-ARM 42X72 X-RAY (DRAPES) ×2 IMPLANT
DRAPE C-ARMOR (DRAPES) ×2 IMPLANT
DRAPE EXTREMITY T 121X128X90 (DRAPE) ×2 IMPLANT
DRAPE IMP U-DRAPE 54X76 (DRAPES) ×3 IMPLANT
DRAPE INCISE IOBAN 66X45 STRL (DRAPES) ×4 IMPLANT
DRAPE ORTHO SPLIT 77X108 STRL (DRAPES) ×6
DRAPE PROXIMA HALF (DRAPES) ×3 IMPLANT
DRAPE SURG ORHT 6 SPLT 77X108 (DRAPES) ×1 IMPLANT
DRAPE U-SHAPE 47X51 STRL (DRAPES) ×3 IMPLANT
DRILL BIT 4.3 (BIT) ×3
DRSG AQUACEL AG ADV 3.5X10 (GAUZE/BANDAGES/DRESSINGS) ×2 IMPLANT
DRSG AQUACEL AG ADV 3.5X14 (GAUZE/BANDAGES/DRESSINGS) ×2 IMPLANT
ELECT REM PT RETURN 9FT ADLT (ELECTROSURGICAL) ×3
ELECTRODE REM PT RTRN 9FT ADLT (ELECTROSURGICAL) ×1 IMPLANT
EVACUATOR 1/8 PVC DRAIN (DRAIN) ×2 IMPLANT
GLOVE BIO SURGEON STRL SZ7 (GLOVE) ×2 IMPLANT
GLOVE BIO SURGEON STRL SZ8.5 (GLOVE) ×3 IMPLANT
GLOVE BIOGEL PI IND STRL 6.5 (GLOVE) IMPLANT
GLOVE BIOGEL PI IND STRL 7.5 (GLOVE) IMPLANT
GLOVE BIOGEL PI IND STRL 8 (GLOVE) ×1 IMPLANT
GLOVE BIOGEL PI IND STRL 8.5 (GLOVE) IMPLANT
GLOVE BIOGEL PI IND STRL 9 (GLOVE) ×1 IMPLANT
GLOVE BIOGEL PI INDICATOR 6.5 (GLOVE) ×2
GLOVE BIOGEL PI INDICATOR 7.5 (GLOVE) ×2
GLOVE BIOGEL PI INDICATOR 8 (GLOVE) ×2
GLOVE BIOGEL PI INDICATOR 8.5 (GLOVE) ×2
GLOVE BIOGEL PI INDICATOR 9 (GLOVE) ×2
GLOVE BIOGEL PI ORTHO PRO SZ7 (GLOVE) ×2
GLOVE ECLIPSE 8.5 STRL (GLOVE) ×2 IMPLANT
GLOVE PI ORTHO PRO STRL SZ7 (GLOVE) IMPLANT
GLOVE SURG ORTHO 8.5 STRL (GLOVE) ×6 IMPLANT
GLOVE SURG SS PI 6.5 STRL IVOR (GLOVE) ×4 IMPLANT
GLOVE SURG SS PI 8.0 STRL IVOR (GLOVE) ×4 IMPLANT
GOWN STRL REUS W/ TWL LRG LVL3 (GOWN DISPOSABLE) ×2 IMPLANT
GOWN STRL REUS W/ TWL XL LVL3 (GOWN DISPOSABLE) ×2 IMPLANT
GOWN STRL REUS W/TWL LRG LVL3 (GOWN DISPOSABLE) ×6
GOWN STRL REUS W/TWL XL LVL3 (GOWN DISPOSABLE) ×6
HANDPIECE INTERPULSE COAX TIP (DISPOSABLE) ×3
K-WIRE 2.0 (WIRE) ×9
K-WIRE FXSTD 280X2XNS SS (WIRE) ×3
KIT BASIN OR (CUSTOM PROCEDURE TRAY) ×3 IMPLANT
KIT ROOM TURNOVER OR (KITS) ×3 IMPLANT
KWIRE FXSTD 280X2XNS SS (WIRE) IMPLANT
LIQUID BAND (GAUZE/BANDAGES/DRESSINGS) ×2 IMPLANT
LOCKPLATE CABLE BUTTON NCP HIP (Orthopedic Implant) ×4 IMPLANT
MANIFOLD NEPTUNE II (INSTRUMENTS) ×3 IMPLANT
MARKER SKIN DUAL TIP RULER LAB (MISCELLANEOUS) ×2 IMPLANT
NEEDLE 22X1 1/2 (OR ONLY) (NEEDLE) ×3 IMPLANT
NS IRRIG 1000ML POUR BTL (IV SOLUTION) ×3 IMPLANT
PACK UNIVERSAL I (CUSTOM PROCEDURE TRAY) ×3 IMPLANT
PAD ARMBOARD 7.5X6 YLW CONV (MISCELLANEOUS) ×6 IMPLANT
PLATE DISTAL FEMUR 18H 355MM (Plate) ×2 IMPLANT
SCREW 5.0 70MM (Screw) ×2 IMPLANT
SCREW CORT NCB SELFTAP 5.0X50 (Screw) ×2 IMPLANT
SCREW CORTICAL 5.0X10MM (Screw) ×2 IMPLANT
SCREW NCB 3.5X75X5X6.2XST (Screw) IMPLANT
SCREW NCB 4.0 32MM (Screw) ×2 IMPLANT
SCREW NCB 4.0X40MM (Screw) ×2 IMPLANT
SCREW NCB 5.0X30MM (Screw) ×2 IMPLANT
SCREW NCB 5.0X34MM (Screw) ×2 IMPLANT
SCREW NCB 5.0X60 (Screw) IMPLANT
SCREW NCB 5.0X60MM (Screw) ×3 IMPLANT
SCREW NCB 5.0X75MM (Screw) ×12 IMPLANT
SCREW NCB PT 75X32X5XHEX (Screw) IMPLANT
SEALER BIPOLAR AQUA 6.0 (INSTRUMENTS) ×2 IMPLANT
SET HNDPC FAN SPRY TIP SCT (DISPOSABLE) IMPLANT
STAPLER VISISTAT 35W (STAPLE) ×4 IMPLANT
SUT MON AB 2-0 CT1 36 (SUTURE) ×8 IMPLANT
SUT VIC AB 1 CT1 27 (SUTURE) ×9
SUT VIC AB 1 CT1 27XBRD ANBCTR (SUTURE) IMPLANT
SUT VLOC 180 0 24IN GS25 (SUTURE) ×2 IMPLANT
SYR CONTROL 10ML LL (SYRINGE) ×3 IMPLANT
TOWEL OR 17X24 6PK STRL BLUE (TOWEL DISPOSABLE) ×5 IMPLANT
TOWEL OR 17X26 10 PK STRL BLUE (TOWEL DISPOSABLE) ×5 IMPLANT
locking cap (Cap) ×12 IMPLANT

## 2014-05-23 NOTE — H&P (View-Only) (Signed)
HALL, ZACH, MD Chief Complaint: Right femur fracture History: [name] is a 81 y.o. female presents with a femur fracture. Patient was walking from her bedroom to the kitchen and took a fall. Her son states that he got a call around 530PM He states that she told him she heard something pop as though something broke. Patient did not lose consciousness. She had no complaints of chest pain. She does have pain in her left leg. She states that she did not trip over anything. She has a prior fracture of the hip about 17 years ago. This was replaced at that time. She has no other specific complaints.  History reviewed. No pertinent past medical history.  Allergies  Allergen Reactions  . Codeine Nausea And Vomiting  . Penicillins Nausea And Vomiting    No current facility-administered medications on file prior to encounter.   Current Outpatient Prescriptions on File Prior to Encounter  Medication Sig Dispense Refill  . Misc Natural Products (FLEX-A-MIN JOINT FLEX) TABS Take 1 tablet by mouth daily.    . Turmeric Curcumin 500 MG CAPS Take 1 capsule by mouth daily as needed (for pain).    . traMADol (ULTRAM) 50 MG tablet Take 0.5 tablets (25 mg total) by mouth every 6 (six) hours as needed. (Patient not taking: Reported on 05/22/2014) 15 tablet 0    Physical Exam: Filed Vitals:   05/22/14 2315  BP: 128/81  Pulse: 100  Temp:   Resp: 17  A+Ox3 Compartments soft/nt EHL/TA/GA intact Pain/swelling at distal femur No ob/cP ABD soft/nt 1+ DP/PT  Image: Dg Chest 1 View  05/22/2014   CLINICAL DATA:  Status post fall.  Left hip pain.  EXAM: CHEST  1 VIEW  COMPARISON:  None.  FINDINGS: There is elevation of the right hemidiaphragm relative to the left. The patient is rotated on the study. Lungs appear clear. Heart size is normal. No pneumothorax or pleural effusion. Advanced degenerative disease right shoulder is noted.  IMPRESSION: No acute cardiopulmonary disease.   Electronically Signed   By:  Thomas  Dalessio M.D.   On: 05/22/2014 20:42   Dg Hip Unilat With Pelvis 2-3 Views Left  05/22/2014   CLINICAL DATA:  Status post fall with pain all lower left hip.  EXAM: LEFT HIP (WITH PELVIS) 2-3 VIEWS  COMPARISON:  None.  FINDINGS: A left hip replacement is identified. There is no dislocation. There is no acute fracture or dislocation. There are severe it degenerative joint changes of right hip.  IMPRESSION: Left hip replacement without dislocation. No acute fracture or dislocation is identified.   Electronically Signed   By: Wei-Chen  Lin M.D.   On: 05/22/2014 20:42   Dg Femur Min 2 Views Left  05/22/2014   CLINICAL DATA:  Left upper leg pain after a fall at home today. Initial encounter.  EXAM: LEFT FEMUR 2 VIEWS  COMPARISON:  None.  FINDINGS: The patient has an acute fracture of the distal femur. Superior margin of the fracture is identified 23 cm above the intercondylar notch. The fracture extends distally through the metaphysis and likely into the intercondylar femur although not well demonstrated on this study. Associated soft tissue swelling is noted. Bones are osteopenic. Right hip replacement is in place.  IMPRESSION: Acute distal femur fracture as described.   Electronically Signed   By: Thomas  Dalessio M.D.   On: 05/22/2014 22:23    A/P: Elderly woman s/p fall with signficant DJD of the knee with distal femur fracture Will require ORIF    Will admit to medical service Will discuss case with my partner (joint specialist) for definitive fx management Bucks traction for comfort Foley to be placed after traction CT scan in AM for pre-op planning    

## 2014-05-23 NOTE — OR Nursing (Signed)
Justin Queen, RN, served as RNFA for this case. 

## 2014-05-23 NOTE — Progress Notes (Addendum)
Patient seen and examined after return from surgery, currently c/o left heal "pins and needles", aaox3, son in room patient does reported has noticed urine color darkening with some odor last several days and reported decreased appetite last two weeks, denies headache/dizziness/sob/chest pain/fever/unilateral weakness. Reported live by herself, walk with a walker for more than a year due to advanced arthritis. Noticed progressive arthritic pain in the last several months. Patient had remote history of left hip fracture required surgery. Does not take meds on regular bases. No other known medical problems. Will check UA, though urine looks clear in foley bag, physical exam does review 3/6 precordial murmur, cxr/EKG unremarkble, again denies sob/chestpain/dizziness, echo pending. Son updated at length, all question answered.

## 2014-05-23 NOTE — Anesthesia Preprocedure Evaluation (Addendum)
Anesthesia Evaluation  Patient identified by MRN, date of birth, ID band Patient awake    Reviewed: Allergy & Precautions, NPO status , Patient's Chart, lab work & pertinent test results  Airway Mallampati: II  TM Distance: >3 FB     Dental  (+) Loose, Poor Dentition, Missing, Dental Advisory Given, Chipped,    Pulmonary neg pulmonary ROS, former smoker,          Cardiovascular negative cardio ROS      Neuro/Psych negative neurological ROS     GI/Hepatic negative GI ROS, Neg liver ROS,   Endo/Other  negative endocrine ROS  Renal/GU negative Renal ROS     Musculoskeletal negative musculoskeletal ROS (+) Arthritis -,   Abdominal (+)  Abdomen: soft. Bowel sounds: normal.  Peds  Hematology negative hematology ROS (+)   Anesthesia Other Findings   Reproductive/Obstetrics negative OB ROS                        Anesthesia Physical Anesthesia Plan  ASA: III  Anesthesia Plan:    Post-op Pain Management:    Induction:   Airway Management Planned:   Additional Equipment:   Intra-op Plan:   Post-operative Plan:   Informed Consent:   Plan Discussed with:   Anesthesia Plan Comments:         Anesthesia Quick Evaluation

## 2014-05-23 NOTE — Anesthesia Postprocedure Evaluation (Signed)
  Anesthesia Post-op Note  Patient: [name]  Procedure(s) Performed: Procedure(s): OPEN REDUCTION INTERNAL FIXATION (ORIF) PERIPROSTHETIC FRACTURE (Left)  Patient Location: PACU  Anesthesia Type:General  Level of Consciousness: awake  Airway and Oxygen Therapy: Patient Spontanous Breathing  Post-op Pain: mild  Post-op Assessment: Post-op Vital signs reviewed  Post-op Vital Signs: Reviewed  Last Vitals:  Filed Vitals:   05/23/14 1445  BP:   Pulse: 74  Temp: 36.5 C  Resp: 14    Complications: No apparent anesthesia complications

## 2014-05-23 NOTE — Discharge Instructions (Signed)
-  Non weight bearing left leg -do not remove surgical dressing -May begin showering 4 days after surgery (dressing is water proof); do not soak or submerge dressing -use walker/wheelchair as instructed -bend the left knee to prevent stiffness

## 2014-05-23 NOTE — Transfer of Care (Signed)
Immediate Anesthesia Transfer of Care Note  Patient: Kathryn HughsRuth J Kendall  Procedure(s) Performed: Procedure(s): OPEN REDUCTION INTERNAL FIXATION (ORIF) PERIPROSTHETIC FRACTURE (Left)  Patient Location: PACU  Anesthesia Type:General  Level of Consciousness: awake, alert  and sedated  Airway & Oxygen Therapy: Patient connected to face mask oxygen  Post-op Assessment: Report given to RN  Post vital signs: stable  Last Vitals:  Filed Vitals:   05/23/14 0651  BP: 114/62  Pulse: 103  Temp: 36.6 C  Resp:     Complications: No apparent anesthesia complications

## 2014-05-23 NOTE — Anesthesia Procedure Notes (Signed)
Procedure Name: Intubation Date/Time: 05/23/2014 9:03 AM Performed by: Ellin GoodieWEAVER, Nechama Escutia M Pre-anesthesia Checklist: Patient identified, Emergency Drugs available, Suction available, Patient being monitored and Timeout performed Patient Re-evaluated:Patient Re-evaluated prior to inductionOxygen Delivery Method: Circle system utilized Preoxygenation: Pre-oxygenation with 100% oxygen Intubation Type: IV induction Ventilation: Mask ventilation without difficulty Laryngoscope Size: Mac and 3 Grade View: Grade I Tube type: Oral Tube size: 7.5 mm Number of attempts: 1 Airway Equipment and Method: Stylet Placement Confirmation: ETT inserted through vocal cords under direct vision,  positive ETCO2 and breath sounds checked- equal and bilateral Secured at: 21 cm Tube secured with: Tape Dental Injury: Teeth and Oropharynx as per pre-operative assessment

## 2014-05-23 NOTE — H&P (Signed)
[facility] History and Physical  GREENLY RARICK ZOX:[id number] DOB: [birth year] DOA: 05/22/2014  Referring physician: Dione Booze, MD PCP: Catalina Pizza, MD   Chief Complaint: Left Femur Fracture  HPI: [name] is a 81 y.o. female presents with a femur fracture. Patient was walking from her bedroom to the kitchen and took a fall. Her son states that he got a call around 530PM He states that she told him she heard something pop as though something broke. Patient did not lose consciousness. She had no complaints of chest pain. She does have pain in her left leg. She states that she did not trip over anything. She has a prior fracture of the hip about 17 years ago. This was replaced at that time. She has no other specific complaints.   Review of Systems:  Constitutional:  No weight loss, night sweats, Fevers, chills HEENT:  No headaches,  itching, ear ache, nasal congestion, post nasal drip,  Cardio-vascular:  No chest pain, +swelling in lower extremities, anasarca, dizziness, palpitations  GI:  No heartburn, indigestion, abdominal pain, nausea, vomiting, diarrhea  Resp:  No shortness of breath with exertion or at rest. No coughing up of blood.No change in color of mucus.No wheezing Skin:  no rash or lesions GU:  no dysuria, change in color of urine, no urgency or frequency Musculoskeletal:  Left LE pain Psych:  No change in mood or affect. No depression or anxiety  History reviewed. No pertinent past medical history. Past Surgical History  Procedure Laterality Date  . Joint replacement     Social History:  reports that she has quit smoking. She does not have any smokeless tobacco history on file. She reports that she does not drink alcohol or use illicit drugs.  Allergies  Allergen Reactions  . Codeine Nausea And Vomiting  . Penicillins Nausea And Vomiting    No family history on file.   Prior to Admission medications   Medication Sig Start Date End Date Taking?  Authorizing Provider  acetaminophen (TYLENOL) 500 MG tablet Take 500 mg by mouth every 6 (six) hours as needed for mild pain.   Yes Historical Provider, MD  Misc Natural Products (FLEX-A-MIN JOINT FLEX) TABS Take 1 tablet by mouth daily.   Yes Historical Provider, MD  Turmeric Curcumin 500 MG CAPS Take 1 capsule by mouth daily as needed (for pain).   Yes Historical Provider, MD  traMADol (ULTRAM) 50 MG tablet Take 0.5 tablets (25 mg total) by mouth every 6 (six) hours as needed. Patient not taking: Reported on 05/22/2014 02/22/14   Gilda Crease, MD   Physical Exam: Filed Vitals:   05/22/14 2240 05/22/14 2242 05/22/14 2300 05/22/14 2315  BP: 138/79  138/83 128/81  Pulse:  100 102 100  Temp:      TempSrc:      Resp: Height:      Weight:      SpO2:  100% 100% 99%    Wt Readings from Last 3 Encounters:  05/22/14 63.504 kg (140 lb)  08/03/07 63.504 kg (140 lb)    General:  Appears calm and comfortable Eyes: PERRL, normal lids, irises & conjunctiva ENT: grossly normal hearing, lips & tongue Neck: no LAD, masses or thyromegaly Cardiovascular: RRR, +murmer. ++LE edema. Respiratory: CTA bilaterally, no w/r/r. Normal respiratory effort. Abdomen: soft, ntnd Skin: no rash or induration seen on limited exam Musculoskeletal: c/o tenderness on the left LE lower thigh Psychiatric: grossly normal mood and affect Neurologic:  grossly non-focal.          Labs on Admission:  Basic Metabolic Panel:  Recent Labs Lab 05/22/14 2101  NA 137  K 3.6  CL 102  CO2 22  GLUCOSE 111*  BUN 24*  CREATININE 1.08  CALCIUM 9.5   Liver Function Tests: No results for input(s): AST, ALT, ALKPHOS, BILITOT, PROT, ALBUMIN in the last 168 hours. No results for input(s): LIPASE, AMYLASE in the last 168 hours. No results for input(s): AMMONIA in the last 168 hours. CBC:  Recent Labs Lab 05/22/14 2101  WBC 10.4  NEUTROABS 8.1*  HGB 12.2  HCT 36.3  MCV 91.0  PLT 206   Cardiac  Enzymes: No results for input(s): CKTOTAL, CKMB, CKMBINDEX, TROPONINI in the last 168 hours.  BNP (last 3 results) No results for input(s): BNP in the last 8760 hours.  ProBNP (last 3 results) No results for input(s): PROBNP in the last 8760 hours.  CBG: No results for input(s): GLUCAP in the last 168 hours.  Radiological Exams on Admission: Dg Chest 1 View  05/22/2014   CLINICAL DATA:  Status post fall.  Left hip pain.  EXAM: CHEST  1 VIEW  COMPARISON:  None.  FINDINGS: There is elevation of the right hemidiaphragm relative to the left. The patient is rotated on the study. Lungs appear clear. Heart size is normal. No pneumothorax or pleural effusion. Advanced degenerative disease right shoulder is noted.  IMPRESSION: No acute cardiopulmonary disease.   Electronically Signed   By: Drusilla Kannerhomas  Dalessio M.D.   On: 05/22/2014 20:42   Dg Hip Unilat With Pelvis 2-3 Views Left  05/22/2014   CLINICAL DATA:  Status post fall with pain all lower left hip.  EXAM: LEFT HIP (WITH PELVIS) 2-3 VIEWS  COMPARISON:  None.  FINDINGS: A left hip replacement is identified. There is no dislocation. There is no acute fracture or dislocation. There are severe it degenerative joint changes of right hip.  IMPRESSION: Left hip replacement without dislocation. No acute fracture or dislocation is identified.   Electronically Signed   By: Sherian ReinWei-Chen  Lin M.D.   On: 05/22/2014 20:42   Dg Femur Min 2 Views Left  05/22/2014   CLINICAL DATA:  Left upper leg pain after a fall at home today. Initial encounter.  EXAM: LEFT FEMUR 2 VIEWS  COMPARISON:  None.  FINDINGS: The patient has an acute fracture of the distal femur. Superior margin of the fracture is identified 23 cm above the intercondylar notch. The fracture extends distally through the metaphysis and likely into the intercondylar femur although not well demonstrated on this study. Associated soft tissue swelling is noted. Bones are osteopenic. Right hip replacement is in place.   IMPRESSION: Acute distal femur fracture as described.   Electronically Signed   By: Drusilla Kannerhomas  Dalessio M.D.   On: 05/22/2014 22:23      Assessment/Plan Active Problems:   Femur fracture, left   1. Left Femoral Fracture -orthopedics to see patient regarding repair -pain control for now  2. Elevated Glucose -will check A1c -Monitor FSBS  3. Murmer -?no prior diagnosis -will get echo to assess    Code Status: Full Code (must indicate code status--if unknown or must be presumed, indicate so) DVT Prophylaxis:SCD Family Communication: Son (indicate person spoken with, if applicable, with phone number if by telephone) Disposition Plan: SNF (indicate anticipated LOS)  Time spent: 70min  [facility] CtrKHAN,SAADAT A [facility] Pager [phone]

## 2014-05-23 NOTE — Interval H&P Note (Signed)
History and Physical Interval Note:  05/23/2014 8:53 AM  [name]  has presented today for surgery, with the diagnosis of left peri prosthetic fracture  The various methods of treatment have been discussed with the patient and family. After consideration of risks, benefits and other options for treatment, the patient has consented to  Procedure(s): OPEN REDUCTION INTERNAL FIXATION (ORIF) PERIPROSTHETIC FRACTURE (Left) as a surgical intervention .  The patient's history has been reviewed, patient examined, no change in status, stable for surgery.  I have reviewed the patient's chart and labs.  Questions were answered to the patient's satisfaction.    Additionally, I spoke with the patient's hospitalist, Dr. Roda ShuttersXu, who stated that the echo for murmur can be done postoperatively. She agrees that we should not delay the patient's surgery.    Alfonzo Arca, Cloyde ReamsBrian James

## 2014-05-23 NOTE — Consult Note (Signed)
Catalina Pizza, MD Chief Complaint: Right femur fracture History: [name] is a 81 y.o. female presents with a femur fracture. Patient was walking from her bedroom to the kitchen and took a fall. Her son states that he got a call around 530PM He states that she told him she heard something pop as though something broke. Patient did not lose consciousness. She had no complaints of chest pain. She does have pain in her left leg. She states that she did not trip over anything. She has a prior fracture of the hip about 17 years ago. This was replaced at that time. She has no other specific complaints.  History reviewed. No pertinent past medical history.  Allergies  Allergen Reactions  . Codeine Nausea And Vomiting  . Penicillins Nausea And Vomiting    No current facility-administered medications on file prior to encounter.   Current Outpatient Prescriptions on File Prior to Encounter  Medication Sig Dispense Refill  . Misc Natural Products (FLEX-A-MIN JOINT FLEX) TABS Take 1 tablet by mouth daily.    . Turmeric Curcumin 500 MG CAPS Take 1 capsule by mouth daily as needed (for pain).    . traMADol (ULTRAM) 50 MG tablet Take 0.5 tablets (25 mg total) by mouth every 6 (six) hours as needed. (Patient not taking: Reported on 05/22/2014) 15 tablet 0    Physical Exam: Filed Vitals:   05/22/14 2315  BP: 128/81  Pulse: 100  Temp:   Resp: 17  A+Ox3 Compartments soft/nt EHL/TA/GA intact Pain/swelling at distal femur No ob/cP ABD soft/nt 1+ DP/PT  Image: Dg Chest 1 View  05/22/2014   CLINICAL DATA:  Status post fall.  Left hip pain.  EXAM: CHEST  1 VIEW  COMPARISON:  None.  FINDINGS: There is elevation of the right hemidiaphragm relative to the left. The patient is rotated on the study. Lungs appear clear. Heart size is normal. No pneumothorax or pleural effusion. Advanced degenerative disease right shoulder is noted.  IMPRESSION: No acute cardiopulmonary disease.   Electronically Signed   By:  Drusilla Kanner M.D.   On: 05/22/2014 20:42   Dg Hip Unilat With Pelvis 2-3 Views Left  05/22/2014   CLINICAL DATA:  Status post fall with pain all lower left hip.  EXAM: LEFT HIP (WITH PELVIS) 2-3 VIEWS  COMPARISON:  None.  FINDINGS: A left hip replacement is identified. There is no dislocation. There is no acute fracture or dislocation. There are severe it degenerative joint changes of right hip.  IMPRESSION: Left hip replacement without dislocation. No acute fracture or dislocation is identified.   Electronically Signed   By: Sherian Rein M.D.   On: 05/22/2014 20:42   Dg Femur Min 2 Views Left  05/22/2014   CLINICAL DATA:  Left upper leg pain after a fall at home today. Initial encounter.  EXAM: LEFT FEMUR 2 VIEWS  COMPARISON:  None.  FINDINGS: The patient has an acute fracture of the distal femur. Superior margin of the fracture is identified 23 cm above the intercondylar notch. The fracture extends distally through the metaphysis and likely into the intercondylar femur although not well demonstrated on this study. Associated soft tissue swelling is noted. Bones are osteopenic. Right hip replacement is in place.  IMPRESSION: Acute distal femur fracture as described.   Electronically Signed   By: Drusilla Kanner M.D.   On: 05/22/2014 22:23    A/P: Elderly woman s/p fall with signficant DJD of the knee with distal femur fracture Will require ORIF  Will admit to medical service Will discuss case with my partner (joint specialist) for definitive fx management Bucks traction for comfort Foley to be placed after traction CT scan in AM for pre-op planning

## 2014-05-23 NOTE — Progress Notes (Signed)
INITIAL NUTRITION ASSESSMENT  DOCUMENTATION CODES Per approved criteria  -Not Applicable   INTERVENTION: Provide Resource Breeze po TID, each supplement provides 250 kcal and 9 grams of protein.  Encourage adequate PO intake.  NUTRITION DIAGNOSIS: Increased nutrient needs related to s/p surgery as evidenced by estimated nutrition needs.   Goal: Pt to meet >/= 90% of their estimated nutrition needs   Monitor:  Diet advancement, weight trends, labs, I/O's  Reason for Assessment: MD consult  81 y.o. female  Admitting Dx: Femur fracture, left  ASSESSMENT: Pt s/p fall with signficant DJD of the knee with distal femur fracture. Pt presents with a femur fracture. Patient was walking from her bedroom to the kitchen and took a fall.  Procedure(3/8): OPEN REDUCTION INTERNAL FIXATION (ORIF) PERIPROSTHETIC FRACTURE (Left)   Pt asleep during time of visit and would not wake. Family present at bedside. He reports pt would have a varied appetite PTA at home. He reports she usually eats 2 full meals a day with no other difficulties. Weight has been stable. Pt is currently on a clear liquid diet. Family member is agreeable to [employer] Breeze to aid in caloric and protein needs. He reports he will encourage pt to consume her food at meals. Unable to perform nutrition focused physical exam during time of visit as family member would like pt to rest. RD to perform during next visit.   Labs: Low GFR. High BUN.  Height: Ht Readings from Last 1 Encounters:  05/22/14 5\' 3"  (1.6 m)    Weight: Wt Readings from Last 1 Encounters:  05/22/14 140 lb (63.504 kg)    Ideal Body Weight: 115 lbs  % Ideal Body Weight: 122%  Wt Readings from Last 10 Encounters:  05/22/14 140 lb (63.504 kg)  08/03/07 140 lb (63.504 kg)    Usual Body Weight: 140 lbs  % Usual Body Weight: 100%  BMI:  Body mass index is 24.81 kg/(m^2).  Estimated Nutritional Needs: Kcal: 1650-1900 Protein: 80-90 grams Fluid:  1.7 - 2L/day  Skin: Incision on L hip, non-pitting LLE edema  Diet Order: Diet clear liquid  EDUCATION NEEDS: -No education needs identified at this time   Intake/Output Summary (Last 24 hours) at 05/23/14 1604 Last data filed at 05/23/14 1445  Gross per 24 hour  Intake 2250.83 ml  Output   1400 ml  Net 850.83 ml    Last BM: 3/6  Labs:   Recent Labs Lab 05/22/14 2101  NA 137  K 3.6  CL 102  CO2 22  BUN 24*  CREATININE 1.08  CALCIUM 9.5  GLUCOSE 111*    CBG (last 3)   Recent Labs  05/23/14 0124 05/23/14 0640 05/23/14 1333  GLUCAP 115* 88 107*    Scheduled Meds: . docusate sodium  100 mg Oral BID  . insulin aspart  0-15 Units Subcutaneous TID WC  . senna  1 tablet Oral BID    Continuous Infusions: . sodium chloride 50 mL/hr at 05/23/14 0150    History reviewed. No pertinent past medical history.  Past Surgical History  Procedure Laterality Date  . Joint replacement      Marijean NiemannStephanie La, MS, RD, LDN Pager # [phone] After hours/ weekend pager # [phone]

## 2014-05-23 NOTE — Brief Op Note (Signed)
05/22/2014 - 05/23/2014  1:02 PM  PATIENT:  [name]  81 y.o. female  PRE-OPERATIVE DIAGNOSIS:  Left peri prosthetic fracture  POST-OPERATIVE DIAGNOSIS:  Left peri prosthetic fracture  PROCEDURE:  Procedure(s): OPEN REDUCTION INTERNAL FIXATION (ORIF) PERIPROSTHETIC FRACTURE (Left)  SURGEON:  Surgeon(s) and Role:    * Samson FredericBrian Miguelangel Korn, MD - Primary  PHYSICIAN ASSISTANT:   ASSISTANTS: Hart CarwinJustin Queen, RNFA   ANESTHESIA:   general  EBL:  Total I/O In: 2000 [I.V.:1500; IV Piggyback:500] Out: 900 [Urine:500; Blood:400]  BLOOD ADMINISTERED:none  DRAINS: (1 medium) Hemovact drain(s) in the LLE with  Suction Open   LOCAL MEDICATIONS USED:  NONE  SPECIMEN:  No Specimen  DISPOSITION OF SPECIMEN:  N/A  COUNTS:  YES  TOURNIQUET:  * No tourniquets in log *  DICTATION: .Other Dictation: Dictation Number pending  PLAN OF CARE: Admit to inpatient   PATIENT DISPOSITION:  PACU - hemodynamically stable.   Delay start of Pharmacological VTE agent (>24hrs) due to surgical blood loss or risk of bleeding: not applicable

## 2014-05-23 NOTE — Progress Notes (Addendum)
Pts BP on assessment was 108/42 manually, HR 110. K. Schorr NP notified, and received order for 500 cc fluid bolus. Will continue to monitor. IV fluids will continue after bolus and will encourage PO intake. UA collected, results show moderate leukocytes, crystals, and hyaline casts.   Rocephin daily at bedtime ordered by Merdis DelayK Schorr NP after notified of UA results.   Lowella DellHudson, Sabriah Hobbins G 05/23/2014 11:49 PM

## 2014-05-24 DIAGNOSIS — D62 Acute posthemorrhagic anemia: Secondary | ICD-10-CM

## 2014-05-24 LAB — COMPREHENSIVE METABOLIC PANEL
ALK PHOS: 43 U/L (ref 39–117)
ALT: 20 U/L (ref 0–35)
AST: 34 U/L (ref 0–37)
Albumin: 2.8 g/dL — ABNORMAL LOW (ref 3.5–5.2)
Anion gap: 5 (ref 5–15)
BUN: 11 mg/dL (ref 6–23)
CALCIUM: 8.2 mg/dL — AB (ref 8.4–10.5)
CO2: 27 mmol/L (ref 19–32)
Chloride: 105 mmol/L (ref 96–112)
Creatinine, Ser: 0.72 mg/dL (ref 0.50–1.10)
GFR calc Af Amer: 88 mL/min — ABNORMAL LOW (ref >90)
GFR calc non Af Amer: 76 mL/min — ABNORMAL LOW (ref >90)
Glucose, Bld: 115 mg/dL — ABNORMAL HIGH (ref 70–99)
POTASSIUM: 3.5 mmol/L (ref 3.5–5.1)
Sodium: 137 mmol/L (ref 135–145)
TOTAL PROTEIN: 5.5 g/dL — AB (ref 6.0–8.3)
Total Bilirubin: 1 mg/dL (ref 0.3–1.2)

## 2014-05-24 LAB — CBC
HCT: 24 % — ABNORMAL LOW (ref 36.0–46.0)
HEMOGLOBIN: 7.8 g/dL — AB (ref 12.0–15.0)
MCH: 30.1 pg (ref 26.0–34.0)
MCHC: 32.5 g/dL (ref 30.0–36.0)
MCV: 92.7 fL (ref 78.0–100.0)
Platelets: 140 10*3/uL — ABNORMAL LOW (ref 150–400)
RBC: 2.59 MIL/uL — ABNORMAL LOW (ref 3.87–5.11)
RDW: 14.3 % (ref 11.5–15.5)
WBC: 9.8 10*3/uL (ref 4.0–10.5)

## 2014-05-24 LAB — HEMOGLOBIN A1C
Hgb A1c MFr Bld: 6.2 % — ABNORMAL HIGH (ref 4.8–5.6)
MEAN PLASMA GLUCOSE: 131 mg/dL

## 2014-05-24 LAB — PREPARE RBC (CROSSMATCH)

## 2014-05-24 LAB — GLUCOSE, CAPILLARY
GLUCOSE-CAPILLARY: 135 mg/dL — AB (ref 70–99)
Glucose-Capillary: 132 mg/dL — ABNORMAL HIGH (ref 70–99)
Glucose-Capillary: 171 mg/dL — ABNORMAL HIGH (ref 70–99)
Glucose-Capillary: 124 mg/dL — ABNORMAL HIGH (ref 70–99)

## 2014-05-24 MED ORDER — ENOXAPARIN SODIUM 40 MG/0.4ML ~~LOC~~ SOLN: 40.0000 mg | SUBCUTANEOUS | Status: DC

## 2014-05-24 MED ORDER — SODIUM CHLORIDE 0.9 % IV BOLUS (SEPSIS): 250.0000 mL | Freq: Once | INTRAVENOUS | Status: DC

## 2014-05-24 MED ORDER — ACETAMINOPHEN 500 MG PO TABS: 1000.0000 mg | ORAL_TABLET | Freq: Three times a day (TID) | ORAL | Status: DC

## 2014-05-24 MED ORDER — SODIUM CHLORIDE 0.9 % IV SOLN
INTRAVENOUS | Status: AC
  Administered 2014-05-25: 01:00:00 via INTRAVENOUS

## 2014-05-24 MED ORDER — SODIUM CHLORIDE 0.9 % IV BOLUS (SEPSIS)
500.0000 mL | Freq: Once | INTRAVENOUS | Status: AC
  Administered 2014-05-24: 500 mL via INTRAVENOUS

## 2014-05-24 MED ORDER — CEFTRIAXONE SODIUM IN DEXTROSE 20 MG/ML IV SOLN
1.0000 g | Freq: Every day | INTRAVENOUS | Status: DC
  Administered 2014-05-24 – 2014-05-26 (×4): 1 g via INTRAVENOUS
  Filled 2014-05-24 (×6): qty 50

## 2014-05-24 MED ORDER — SODIUM CHLORIDE 0.9 % IV SOLN: Freq: Once | INTRAVENOUS | Status: DC

## 2014-05-24 MED ORDER — TRAMADOL HCL 50 MG PO TABS: 25.0000 mg | ORAL_TABLET | Freq: Four times a day (QID) | ORAL | Status: DC | PRN

## 2014-05-24 NOTE — Evaluation (Signed)
Physical Therapy Evaluation Patient Details Name: [name] MRN: [id number] DOB: 1926-02-06 Today's Date: 05/24/2014   History of Present Illness  [name] is a 81 y.o. female presents with a femur fracture. Pt s/p L periprosthetic 3/8.  Clinical Impression  Pt presenting extremely debilitated and bilat knee contractures as well as mild confusion. Pt currently requiring maxAx2 for all mobility. Pt was living independently PTA. Acute PT to follow to progress mobility as able.     Follow Up Recommendations SNF;Supervision/Assistance - 24 hour    Equipment Recommendations  None recommended by PT    Recommendations for Other Services       Precautions / Restrictions Precautions Precautions: Fall Restrictions Weight Bearing Restrictions: Yes LLE Weight Bearing: Non weight bearing      Mobility  Bed Mobility Overal bed mobility: +2 for physical assistance             General bed mobility comments: max assistx2 for rolling  Transfers Overall transfer level: Needs assistance Equipment used:  (2 person lift with bed pad and gait belt) Transfers: Sit to/from Stand;Stand Pivot Transfers Sit to Stand: +2 physical assistance;Total assist Stand pivot transfers: Total assist;+2 physical assistance       General transfer comment: 3rd person to hold L LE to maintain NWB. pt with minimal active participation. pt unable to achieve L LE knee extension due to contracture  Ambulation/Gait Ambulation/Gait assistance:  (pt non-ambulatory)              Stairs            Wheelchair Mobility    Modified Rankin (Stroke Patients Only)       Balance Overall balance assessment: Needs assistance Sitting-balance support: Feet supported;Bilateral upper extremity supported Sitting balance-Leahy Scale: Poor Sitting balance - Comments: min/modA to maintain balance, pt with posterior lean, max v/c's to pull self forward to maintain upright position                                      Pertinent Vitals/Pain Pain Assessment: Faces Faces Pain Scale: Hurts even more Pain Location: L LE with mvmt Pain Descriptors / Indicators: Grimacing Pain Intervention(s): Monitored during session    Home Living Family/patient expects to be discharged to:: Skilled nursing facility                      Prior Function Level of Independence: Needs assistance   Gait / Transfers Assistance Needed: per son pt uses "stool" and pulls self around house. pt non-ambulatory.  ADL's / Homemaking Assistance Needed: per son pt was doing ADls but not IADLs        Hand Dominance        Extremity/Trunk Assessment   Upper Extremity Assessment: Generalized weakness           Lower Extremity Assessment: RLE deficits/detail;LLE deficits/detail RLE Deficits / Details: knee flexion contracture, minimal active mvmt LLE Deficits / Details: knee flexion contracture. pt with no active mvmt due to pain  Cervical / Trunk Assessment: Kyphotic  Communication   Communication: No difficulties  Cognition Arousal/Alertness: Lethargic;Suspect due to medications Behavior During Therapy: [facility] for tasks assessed/performed Overall Cognitive Status: Impaired/Different from baseline Area of Impairment: Attention;Following commands;Safety/judgement;Awareness;Problem solving   Current Attention Level: Sustained Memory: Decreased short-term memory;Decreased recall of precautions Following Commands: Follows one step commands inconsistently Safety/Judgement: Decreased awareness of deficits Awareness: Intellectual Problem Solving:  Slow processing;Difficulty sequencing;Requires verbal cues;Requires tactile cues General Comments: pt unaware she fell but could [facility]. unable to state date    General Comments      Exercises        Assessment/Plan    PT Assessment Patient needs continued PT services  PT Diagnosis Generalized weakness;Acute  pain   PT Problem List Decreased strength;Decreased range of motion;Decreased activity tolerance;Decreased balance;Decreased mobility;Decreased coordination;Decreased knowledge of use of DME;Decreased knowledge of precautions  PT Treatment Interventions DME instruction;Functional mobility training;Therapeutic activities;Therapeutic exercise   PT Goals (Current goals can be found in the Care Plan section) Acute Rehab PT Goals Patient Stated Goal: didn't state PT Goal Formulation: With patient/family Time For Goal Achievement: 05/31/14 Potential to Achieve Goals: Good    Frequency Min 2X/week   Barriers to discharge Decreased caregiver support lives alone    Co-evaluation               End of Session Equipment Utilized During Treatment: Gait belt Activity Tolerance: Patient tolerated treatment well;Patient limited by pain Patient left: in chair;with call bell/phone within reach;with family/visitor present Nurse Communication: Mobility status;Need for lift equipment (use hoyer lift)         Time: 8295-62131415-1435 PT Time Calculation (min) (ACUTE ONLY): 20 min   Charges:   PT Evaluation $Initial PT Evaluation Tier I: 1 Procedure     PT G CodesMarcene Brawn:        Chan Rosasco Marie 05/24/2014, 3:50 PM  Lewis ShockAshly Darina Hartwell, PT, DPT Pager #: [phone] Office #: [phone]

## 2014-05-24 NOTE — Progress Notes (Signed)
  Echocardiogram 2D Echocardiogram has been performed.  [name] 05/24/2014, 2:16 PM

## 2014-05-24 NOTE — Op Note (Signed)
[name], [name] NO.:  [id number]  MEDICAL RECORD NO.:  [id number]  LOCATION:  [id number]                        FACILITY:  [facility]  PHYSICIAN:  Samson Frederic, MD     DATE OF BIRTH:  21-Mar-1925  DATE OF PROCEDURE:  05/23/2014 DATE OF DISCHARGE:                              OPERATIVE REPORT   SURGEON:  Samson Frederic, MD.  ASSISTANT:  Hart Carwin, RNFA.  PREOPERATIVE DIAGNOSIS:  [place] C periprosthetic left femur fracture.  POSTOPERATIVE DIAGNOSIS:  [place] C periprosthetic left femur fracture.  PROCEDURE PERFORMED:  Open reduction and internal fixation of left periprosthetic femur fracture.  IMPLANTS: 1. 18 hole Zimmer NCB distal femur periprosthetic plate. 2. 4.0 and 5.0 mm screws. 3. Cable button x2. 4. Adult recon cable x2. 5. Locking screw inserts x5.  ANESTHESIA:  General.  COMPLICATIONS:  None.  TUBE AND DRAINS:  Medium Hemovac x1.  DISPOSITION:  Stable to PACU.  SPECIMENS:  None.  INDICATIONS:  The patient is an 81 year old female who is minimally household ambulatory, who tripped and sustained a ground-level fall today in her kitchen.  She had pain and was not able to weight bear. She was brought to the emergency department where radiographs revealed a cemented total hip arthroplasty with a distal periprosthetic femur fracture. The fracture extended from the distal femur approximately 4 cm to the distal end of the cement mantle.  The fracture was displaced.  She was seen by the Hospitalist Service and underwent perioperative risk stratification and medical optimization.  Risks, benefits, and alternatives to open reduction and internal fixation of her left femur fracture were explained, she elected to proceed.  DESCRIPTION OF PROCEDURE IN DETAIL:  The patient was correctly identified in the preoperative hold area using 2 identifiers.  Surgical site was marked by myself.  The patient was taken to the operating room. General  anesthesia was induced in her bed.  Foley catheter was placed. The patient then underwent general anesthesia.  She was then transferred to [facility] flat-top table and then turned in the lateral decubitus position.  All bony prominences were well padded.  Axillary roll was placed.  Left lower extremity was prepped and draped in normal sterile surgical fashion.  Time-out was called, verifying sign and site of surgery.  She did receive 2 g of Ancef within 60 minutes of beginning of the procedure.  I  began by examining her left lower extremity.  I made an incision from the greater trochanter and I utilized her previous total hip incision and I carried the incision down the Gerdy tubercle. Blunt dissection was performed until I got to the IT band.  I split the IT band in line with fibers and then I carefully dissected vastus lateralis of the posterior septum and I reflected the entire muscle anteriorly.  I identified her fracture.  Her fracture propagated distally to about the level of the femoral trochlea.  She had a large sagittal plane split that extended from down near the knee up until approximately 4 cm to the cement mantle.  Using traction and rotation, I reduced the fracture and this was held in place with clamps.  I then placed 3 sets  of double 18-gauge stainless steel wires and these were tensioned and clipped.  I selected the longest NCB periprosthetic plate and this was held in a place with K wires.  I checked the plate position and fracture reduction with biplanar fluoroscopy.  I then compressed the plate down the bone using a 5.0 partially threaded screw distally, 4.0 cortical screw just distal to the cement mantle, and 1 in between these 2 screws.  I then placed 2 adult recon cables around the femoral implant through the beaded islets.  I placed a single unicortical locking screw proximally.  I then placed a bicortical screw through the cement mantle. I turned my attention  distally.  I placed 2 interfragmentary locking screws and then on the distal block, I placed four 5.0 mm cortical screws.  I applied locking inserts to the distal screws, turned them into locking screws.  Final AP and lateral fluoroscopy views were used to confirm near anatomic reduction and excellent hardware placement.  There was no chondral penetration in the knee.  The wound was then copiously irrigated with sterile saline using pulse lavage.  The IT band was closed with #1 Vicryl and #1 V-Loc suture over a medium HemoVac drain.  Deep dermal layer was closed with 2-0 interrupted Monocryl sutures.  Skin was reapproximated with staples.  Then, skin glue was applied.  Once the glue was fully hardened, Aquacel Ag dressing was applied.  The patient was then flipped supine and extubated.  She was transported to the recovery room in stable condition.  Sponge, needle, and instrument counts were correct at the end of the case x2.  There were no known complications.  I discussed operative events and findings with the patient's son.  He understands we would admit the patient to the hospital.  She will be nonweightbearing to the left lower extremity for 10 to 12 weeks.  She will have Lovenox for DVT prophylaxis.  She will receive physical therapy for range of motion of the left knee as well as bed-to-chair transfers.  She will likely need skilled nursing placement.  He understands I need to see her in the office for 2-week followup.  All questions were solicited and answered to their satisfaction.          ______________________________ Samson FredericBrian Rosemond Lyttle, MD     BS/MEDQ  D:  05/23/2014  T:  05/24/2014  Job:  [id number]

## 2014-05-24 NOTE — Progress Notes (Signed)
PROGRESS NOTE  [name]:[id number] DOB: 07/22/1925 DOA: 05/22/2014 PCP: Catalina Pizza, MD  HPI: [name] is a 81 y.o. female presents with a femur fracture. Patient was walking from her bedroom to the kitchen and took a fall. Her son states that he got a call around 530PM He states that she told him she heard something pop as though something broke. Patient did not lose consciousness.   Subjective / 24 H Interval events - reports pins and needles in her left heel  Assessment/Plan: Principal Problem:   Peri-prosthetic fracture of femur following total hip arthroplasty Active Problems:   Femur fracture, left   Fall at home   Undiagnosed cardiac murmurs   FTT (failure to thrive) in adult   Left femoral fracture - s/p ORIF periprosthetic fracture left - PT to evaluate  ABLA  - post op - transfuse 1U pRBC today - mild hypotension  Elevated CBG on admission - prediabetes based on A1C - Metformin on d/c  UTI - cultures pending, Ceftriaxone  Diet: DIET SOFT Diet - low sodium heart healthy Fluids: NS DVT Prophylaxis: Lovenox  Code Status: Full Code Family Communication: none bedside  Disposition Plan: remain inpatient  Consultants:  Orthopedic surgery   Procedures:  ORIF left femur 3/8   Antibiotics  Anti-infectives    Start     Dose/Rate Route Frequency Ordered Stop   05/24/14 0100  cefTRIAXone (ROCEPHIN) 1 g in dextrose 5 % 50 mL IVPB - Premix     1 g 100 mL/hr over 30 Minutes Intravenous Daily at bedtime 05/24/14 0018     05/23/14 0900  ceFAZolin (ANCEF) IVPB 2 g/50 mL premix     2 g 100 mL/hr over 30 Minutes Intravenous  Once 05/23/14 0848 05/23/14 0937       Studies  Dg Chest 1 View  05/22/2014   CLINICAL DATA:  Status post fall.  Left hip pain.  EXAM: CHEST  1 VIEW  COMPARISON:  None.  FINDINGS: There is elevation of the right hemidiaphragm relative to the left. The patient is rotated on the study. Lungs appear clear. Heart size is normal.  No pneumothorax or pleural effusion. Advanced degenerative disease right shoulder is noted.  IMPRESSION: No acute cardiopulmonary disease.   Electronically Signed   By: Drusilla Kanner M.D.   On: 05/22/2014 20:42   Pelvis Portable  05/23/2014   CLINICAL DATA:  Postoperative radiographs.  LEFT femur fracture.  EXAM: PORTABLE PELVIS 1-2 VIEWS; LEFT FEMUR 2 VIEWS  COMPARISON:  02/22/2014.  05/23/2014 intraoperative fluoroscopy.  FINDINGS: LEFT total hip arthroplasty appears unchanged. Severe RIGHT hip osteoarthritis incidentally noted with complete loss of the joint space. Lateral buttress plate with encircling cables fixing a distal femur fracture. Expected postsurgical changes in the soft tissues. No hardware complicating features. One of the lag screws protrudes through the medial femoral condyle near the adductor tubercle.  IMPRESSION: Complex ORIF of distal femur fracture.   Electronically Signed   By: Andreas Newport M.D.   On: 05/23/2014 14:36   Ct Femur Left Wo Contrast  05/23/2014   CLINICAL DATA:  Left femur fracture after fall.  EXAM: CT OF THE LEFT FEMUR WITHOUT CONTRAST  TECHNIQUE: Multidetector CT imaging was performed according to the standard protocol. Multiplanar CT image reconstructions were also generated.  COMPARISON:  Radiographs 1 day prior.  FINDINGS: Displaced comminuted mildly impacted distal femur fracture with less than 1 cm medial displacement distal fracture fragment. There is less than 2 cm of  osseous overriding. Anterior laterally of the fracture line approaches but does not definitively involve the patellofemoral articulation. There is no extension distally to involve the tibial femoral articular surface or intercondylar notch. There is moderate tricompartmental osteoarthritis of the knee. The more proximal femur is intact. Total hip arthroplasty without periprosthetic fracture. There is a small knee joint effusion. Soft tissue edema is seen about the fracture site.  IMPRESSION:  Displaced comminuted mildly impacted distal femur fracture. No definite intra-articular extension.   Electronically Signed   By: Rubye Oaks M.D.   On: 05/23/2014 03:23   Dg C-arm 61-120 Min  05/23/2014   CLINICAL DATA:  ORIF of periprosthetic left femur fracture  EXAM: DG C-ARM 61-120 MIN; LEFT FEMUR 2 VIEWS  FLUOROSCOPY TIME:  Fluoroscopy Time (in minutes and seconds): 2 minutes 47 seconds  Number of Acquired Images:  7  COMPARISON:  CT left femur dated 05/23/2014  FINDINGS: Intraoperative fluoroscopic spot images during lateral compression plate and cerclage wire fixation of a periprosthetic left femur fracture.  IMPRESSION: Intraoperative fluoroscopic spot images during ORIF of a periprosthetic left femur fracture, as above.   Electronically Signed   By: Charline Bills M.D.   On: 05/23/2014 13:25   Dg Hip Unilat With Pelvis 2-3 Views Left  05/22/2014   CLINICAL DATA:  Status post fall with pain all lower left hip.  EXAM: LEFT HIP (WITH PELVIS) 2-3 VIEWS  COMPARISON:  None.  FINDINGS: A left hip replacement is identified. There is no dislocation. There is no acute fracture or dislocation. There are severe it degenerative joint changes of right hip.  IMPRESSION: Left hip replacement without dislocation. No acute fracture or dislocation is identified.   Electronically Signed   By: Sherian Rein M.D.   On: 05/22/2014 20:42   Dg Femur Min 2 Views Left  05/23/2014   CLINICAL DATA:  Postoperative radiographs.  LEFT femur fracture.  EXAM: PORTABLE PELVIS 1-2 VIEWS; LEFT FEMUR 2 VIEWS  COMPARISON:  02/22/2014.  05/23/2014 intraoperative fluoroscopy.  FINDINGS: LEFT total hip arthroplasty appears unchanged. Severe RIGHT hip osteoarthritis incidentally noted with complete loss of the joint space. Lateral buttress plate with encircling cables fixing a distal femur fracture. Expected postsurgical changes in the soft tissues. No hardware complicating features. One of the lag screws protrudes through the medial  femoral condyle near the adductor tubercle.  IMPRESSION: Complex ORIF of distal femur fracture.   Electronically Signed   By: Andreas Newport M.D.   On: 05/23/2014 14:36   Dg Femur Min 2 Views Left  05/23/2014   CLINICAL DATA:  ORIF of periprosthetic left femur fracture  EXAM: DG C-ARM 61-120 MIN; LEFT FEMUR 2 VIEWS  FLUOROSCOPY TIME:  Fluoroscopy Time (in minutes and seconds): 2 minutes 47 seconds  Number of Acquired Images:  7  COMPARISON:  CT left femur dated 05/23/2014  FINDINGS: Intraoperative fluoroscopic spot images during lateral compression plate and cerclage wire fixation of a periprosthetic left femur fracture.  IMPRESSION: Intraoperative fluoroscopic spot images during ORIF of a periprosthetic left femur fracture, as above.   Electronically Signed   By: Charline Bills M.D.   On: 05/23/2014 13:25   Dg Femur Min 2 Views Left  05/22/2014   CLINICAL DATA:  Left upper leg pain after a fall at home today. Initial encounter.  EXAM: LEFT FEMUR 2 VIEWS  COMPARISON:  None.  FINDINGS: The patient has an acute fracture of the distal femur. Superior margin of the fracture is identified 23 cm above the intercondylar  notch. The fracture extends distally through the metaphysis and likely into the intercondylar femur although not well demonstrated on this study. Associated soft tissue swelling is noted. Bones are osteopenic. Right hip replacement is in place.  IMPRESSION: Acute distal femur fracture as described.   Electronically Signed   By: Drusilla Kannerhomas  Dalessio M.D.   On: 05/22/2014 22:23    Objective  Filed Vitals:   05/24/14 0400 05/24/14 0529 05/24/14 1125 05/24/14 1300  BP:  99/48 86/44 85/43   Pulse:  103 101 98  Temp:  98.2 F (36.8 C) 98.7 F (37.1 C) 98.5 F (36.9 C)  TempSrc:  Axillary Oral Oral  Resp: 16 17 17 16   Height:      Weight:      SpO2: 99% 99% 99% 98%    Intake/Output Summary (Last 24 hours) at 05/24/14 1307 Last data filed at 05/24/14 1300  Gross per 24 hour  Intake   1275  ml  Output    850 ml  Net    425 ml   Filed Weights   05/22/14 1939  Weight: 63.504 kg (140 lb)    Exam:  General:  NAD  HEENT: no scleral icterus  Cardiovascular: RRR, 3/6 SEM, good pulses, no edems  Respiratory: CTA biL  Abdomen: soft, non tender  MSK/Extremities: no clubbing/cyanosis  Skin: no rashes   Neuro: non focal   Data Reviewed: Basic Metabolic Panel:  Recent Labs Lab 05/22/14 2101 05/23/14 2140 05/24/14 0620  NA 137  --  137  K 3.6  --  3.5  CL 102  --  105  CO2 22  --  27  GLUCOSE 111*  --  115*  BUN 24*  --  11  CREATININE 1.08 0.71 0.72  CALCIUM 9.5  --  8.2*   Liver Function Tests:  Recent Labs Lab 05/24/14 0620  AST 34  ALT 20  ALKPHOS 43  BILITOT 1.0  PROT 5.5*  ALBUMIN 2.8*   CBC:  Recent Labs Lab 05/22/14 2101 05/23/14 2140 05/24/14 0620  WBC 10.4 8.0 9.8  NEUTROABS 8.1*  --   --   HGB 12.2 8.7* 7.8*  HCT 36.3 26.4* 24.0*  MCV 91.0 91.3 92.7  PLT 206 156 140*   CBG:  Recent Labs Lab 05/23/14 1333 05/23/14 1647 05/23/14 2142 05/24/14 0646 05/24/14 1203  GLUCAP 107* 109* 146* 132* 135*    Recent Results (from the past 240 hour(s))  Surgical pcr screen     Status: None   Collection Time: 05/23/14  5:18 AM  Result Value Ref Range Status   MRSA, PCR NEGATIVE NEGATIVE Final   Staphylococcus aureus NEGATIVE NEGATIVE Final    Comment:        The Xpert SA Assay (FDA approved for NASAL specimens in patients over 921 years of age), is one component of a comprehensive surveillance program.  Test performance has been validated by [facility] for patients greater than or equal to 79 year old. It is not intended to diagnose infection nor to guide or monitor treatment.      Scheduled Meds: . sodium chloride   Intravenous Once  . acetaminophen  1,000 mg Oral 3 times per day   Or  . acetaminophen  650 mg Rectal 3 times per day  . cefTRIAXone (ROCEPHIN)  IV  1 g Intravenous QHS  . docusate sodium  100 mg Oral  BID  . enoxaparin (LOVENOX) injection  40 mg Subcutaneous Q24H  . feeding supplement (RESOURCE BREEZE)  1 Container  Oral TID BM  . insulin aspart  0-15 Units Subcutaneous TID WC  . senna  1 tablet Oral BID   Continuous Infusions:   Pamella Pert, MD [facility] Pager [phone]. If 7 PM - 7 AM, please contact night-coverage at [email or web address], password [facility] 05/24/2014, 1:07 PM  LOS: 1 day

## 2014-05-24 NOTE — Progress Notes (Signed)
   Subjective:  Patient reports pain as mild.  Denies N/V/CP/SOB.  Objective:   VITALS:   Filed Vitals:   05/23/14 2353 05/24/14 0100 05/24/14 0400 05/24/14 0529  BP:  100/48  99/48  Pulse:  101  103  Temp:  99.8 F (37.7 C)  98.2 F (36.8 C)  TempSrc:  Axillary  Axillary  Resp: 16 15 16 17   Height:      Weight:      SpO2: 97% 100% 99% 99%    ABD soft Sensation intact distally Intact pulses distally Dorsiflexion/Plantar flexion intact Incision: dressing C/D/I Compartment soft   Lab Results  Component Value Date   WBC 9.8 05/24/2014   HGB 7.8* 05/24/2014   HCT 24.0* 05/24/2014   MCV 92.7 05/24/2014   PLT 140* 05/24/2014   BMET    Component Value Date/Time   NA 137 05/22/2014 2101   K 3.6 05/22/2014 2101   CL 102 05/22/2014 2101   CO2 22 05/22/2014 2101   GLUCOSE 111* 05/22/2014 2101   BUN 24* 05/22/2014 2101   CREATININE 0.71 05/23/2014 2140   CALCIUM 9.5 05/22/2014 2101   GFRNONAA 76* 05/23/2014 2140   GFRAA 88* 05/23/2014 2140     Assessment/Plan: 1 Day Post-Op   Principal Problem:   Femur fracture, left Active Problems:   Fall at home   Undiagnosed cardiac murmurs   FTT (failure to thrive) in adult   Peri-prosthetic fracture of femur following total hip arthroplasty   NWB LLE PT/OT: ROM L knee, bed --> chair xfers IV ancef x23 hrs UTI per primary team DVT ppx: lovenox x30 days, SCDs, TEDs Acute blood loss anemia: symptomatic with hypotension and tachycardia, transfuse 1 unit PRBCs Dispo: SNF placement   Salvatore Shear, Cloyde ReamsBrian James 05/24/2014, 7:29 AM   Samson FredericBrian Bilal Manzer, MD Cell [phone]

## 2014-05-24 NOTE — Progress Notes (Signed)
Pt skin accessed.  Skin dry and flaky, lower extremity redness noted on calves and feet.

## 2014-05-24 NOTE — Progress Notes (Signed)
OT Cancellation Note  Patient Details Name: Sandrea HughsRuth J Doscher MRN: [id number] DOB: [birth year]   Cancelled Treatment:    Reason Eval/Treat Not Completed: Other (comment) Pt has Medicare and current D/C plan is SNF. No apparent immediate acute care OT needs, therefore will defer OT to SNF. If OT eval is needed please call Acute Rehab Dept. at [phone] or text page OT at [phone].    Nena JordanMiller, Charlisha Market M 05/24/2014, 3:13 PM

## 2014-05-25 DIAGNOSIS — W19XXXD Unspecified fall, subsequent encounter: Secondary | ICD-10-CM

## 2014-05-25 LAB — URINALYSIS, ROUTINE W REFLEX MICROSCOPIC
BILIRUBIN URINE: NEGATIVE
GLUCOSE, UA: NEGATIVE mg/dL
KETONES UR: NEGATIVE mg/dL
LEUKOCYTES UA: NEGATIVE
Nitrite: NEGATIVE
PH: 5 (ref 5.0–8.0)
PROTEIN: 30 mg/dL — AB
Specific Gravity, Urine: 1.026 (ref 1.005–1.030)
Urobilinogen, UA: 1 mg/dL (ref 0.0–1.0)

## 2014-05-25 LAB — TYPE AND SCREEN
ABO/RH(D): O POS
Antibody Screen: NEGATIVE
UNIT DIVISION: 0

## 2014-05-25 LAB — GLUCOSE, CAPILLARY
GLUCOSE-CAPILLARY: 137 mg/dL — AB (ref 70–99)
GLUCOSE-CAPILLARY: 110 mg/dL — AB (ref 70–99)
GLUCOSE-CAPILLARY: 105 mg/dL — AB (ref 70–99)
GLUCOSE-CAPILLARY: 188 mg/dL — AB (ref 70–99)

## 2014-05-25 LAB — CBC
HCT: 25.8 % — ABNORMAL LOW (ref 36.0–46.0)
HEMOGLOBIN: 8.3 g/dL — AB (ref 12.0–15.0)
MCH: 29.9 pg (ref 26.0–34.0)
MCHC: 32.2 g/dL (ref 30.0–36.0)
MCV: 92.8 fL (ref 78.0–100.0)
Platelets: 141 10*3/uL — ABNORMAL LOW (ref 150–400)
RBC: 2.78 MIL/uL — ABNORMAL LOW (ref 3.87–5.11)
RDW: 14.7 % (ref 11.5–15.5)
WBC: 11.2 10*3/uL — AB (ref 4.0–10.5)

## 2014-05-25 LAB — URINE MICROSCOPIC-ADD ON

## 2014-05-25 LAB — BASIC METABOLIC PANEL
Anion gap: 6 (ref 5–15)
BUN: 8 mg/dL (ref 6–23)
CO2: 24 mmol/L (ref 19–32)
Calcium: 7.8 mg/dL — ABNORMAL LOW (ref 8.4–10.5)
Chloride: 107 mmol/L (ref 96–112)
Creatinine, Ser: 0.66 mg/dL (ref 0.50–1.10)
GFR calc Af Amer: >90 mL/min (ref >90)
GFR, EST NON AFRICAN AMERICAN: 78 mL/min — AB (ref >90)
Glucose, Bld: 129 mg/dL — ABNORMAL HIGH (ref 70–99)
Potassium: 3.2 mmol/L — ABNORMAL LOW (ref 3.5–5.1)
Sodium: 137 mmol/L (ref 135–145)

## 2014-05-25 LAB — VITAMIN D 25 HYDROXY (VIT D DEFICIENCY, FRACTURES): Vit D, 25-Hydroxy: 28.8 ng/mL — ABNORMAL LOW (ref 30.0–100.0)

## 2014-05-25 MED ORDER — CETYLPYRIDINIUM CHLORIDE 0.05 % MT LIQD
7.0000 mL | Freq: Two times a day (BID) | OROMUCOSAL | Status: DC
  Administered 2014-05-25 – 2014-05-26 (×4): 7 mL via OROMUCOSAL

## 2014-05-25 MED ORDER — ENSURE COMPLETE PO LIQD
237.0000 mL | Freq: Three times a day (TID) | ORAL | Status: DC
  Administered 2014-05-25 – 2014-05-27 (×4): 237 mL via ORAL

## 2014-05-25 MED ORDER — GI COCKTAIL ~~LOC~~
30.0000 mL | Freq: Once | ORAL | Status: AC
  Administered 2014-05-25: 30 mL via ORAL
  Filled 2014-05-25: qty 30

## 2014-05-25 MED ORDER — CALCIUM CARBONATE-VITAMIN D 500-200 MG-UNIT PO TABS
1.0000 | ORAL_TABLET | Freq: Two times a day (BID) | ORAL | Status: DC
  Administered 2014-05-25 – 2014-05-27 (×5): 1 via ORAL
  Filled 2014-05-25 (×4): qty 1

## 2014-05-25 NOTE — Progress Notes (Signed)
Pt continues to be hypotensive (95/46) after fluid bolus earlier today, 1 unit blood, and continuous IV fluids. HR improved to 90s. Merdis DelayK. Schorr NP notified, received order for another 500 cc fluid bolus. Foley catheter was removed morning of 3/9, pt did not void all day. Bladder scan at 8:45 pm showed 198 ccs. Pt had no urge to void, abdomen was not distended- did attempt to use bedpan unsuccessfully. I&O cath at 11 pm returned 400 ccs amber urine. Will continue to monitor blood pressure and urine output.   Lowella DellHudson, Emiliya Chretien G 05/25/2014 12:58 AM

## 2014-05-25 NOTE — Care Management Note (Signed)
    Page 1 of 1   05/25/2014     3:53:27 PM CARE MANAGEMENT NOTE 05/25/2014  Patient:  [name],[name]   Account Number:  [id number]  Date Initiated:  05/25/2014  Documentation initiated by:  Elmer BalesOBARGE,Jene Oravec  Subjective/Objective Assessment:   Patient was admitted for a OPEN REDUCTION INTERNAL FIXATION (ORIF) PERIPROSTHETIC FRACTURE s/p/fall.     Action/Plan:   Will follow for discharge needs pending PT/OT evals and physician orders.   Anticipated DC Date:     Anticipated DC Plan:  SKILLED NURSING FACILITY  In-house referral  Clinical Social Worker      DC Planning Services  CM consult      Choice offered to / List presented to:             Status of service:  In process, will continue to follow Medicare Important Message given?   (If response is "NO", the following Medicare IM given date fields will be blank) Date Medicare IM given:   Medicare IM given by:   Date Additional Medicare IM given:   Additional Medicare IM given by:    Discharge Disposition:    Per UR Regulation:  Reviewed for med. necessity/level of care/duration of stay  If discussed at Long Length of Stay Meetings, dates discussed:    Comments:

## 2014-05-25 NOTE — Progress Notes (Signed)
UR complete.  Benjy Kana RN, MSN 

## 2014-05-25 NOTE — Clinical Social Work Psychosocial (Signed)
Clinical Social Work Department BRIEF PSYCHOSOCIAL ASSESSMENT 05/25/2014  Patient:  [name],[name]     Account Number:  [id number]     Admit date:  05/22/2014  Clinical Social Worker:  Read DriversINGLE,Sulay Brymer, LCSWA  Date/Time:  05/25/2014 10:57 AM  Referred by:  Physician  Date Referred:  05/25/2014 Referred for  SNF Placement  Psychosocial assessment   Other Referral:   none   Interview type:  Other - See comment Other interview type:   patient and son [name]    PSYCHOSOCIAL DATA Living Status:  ALONE Admitted from facility:   Level of care:   Primary support name:  [name] Primary support relationship to patient:  CHILD, ADULT Degree of support available:   strong    CURRENT CONCERNS Current Concerns  Post-Acute Placement   Other Concerns:   none    SOCIAL WORK ASSESSMENT / PLAN CSW assessed patient at bedside.  Patient is alert and oriented at present time.  Son was at bedside and patient wished for son to remain during this assessment and participate.  Patient's wishes were honored.  [name] and wife are supportive of patient.  PT recommended STR/SNF at time of discharge. Patient is agreeable and son is supportive.  [place] [facility].  CSW called and left message with admission's coordinator to secure bed at time of discharge- projected tomorrow Friday 05/26/2014.  Patient will need EMS transportation.  Patient is optimisitic regarding her progress and STR.  Patient is thankful to possibly be able to return home after STR to enjoy her flower garden.   Assessment/plan status:  Psychosocial Support/Ongoing Assessment of Needs Other assessment/ plan:   FL2  PASARR   Information/referral to community resources:   SNF/STR    PATIENT'S/FAMILY'S RESPONSE TO PLAN OF CARE: Patient is appreciative of CSW assistance.  Patient [place] agree[place] and [facility].  Patient will need  transportation at time of dc.       [name], LCSWA [phone]  Psychiatric & Orthopedics (5N 1-16) Clinical Social Worker

## 2014-05-25 NOTE — Progress Notes (Signed)
PROGRESS NOTE  [name] ZOX:[id number] DOB: [birth year] DOA: 05/22/2014 PCP: Kathryn Pizza, MD  HPI: [name] is a 81 y.o. female presents with a femur fracture. Patient was walking from her bedroom to the kitchen and took a fall. Her son states that he got a call around 530PM He states that she told him she heard something pop as though something broke. Patient did not lose consciousness.   Subjective / 24 H Interval events - more alert today, more interactive, no complaints   Assessment/Plan: Principal Problem:   Peri-prosthetic fracture of femur following total hip arthroplasty Active Problems:   Femur fracture, left   Fall at home   Undiagnosed cardiac murmurs   FTT (failure to thrive) in adult   Left femoral fracture - s/p ORIF periprosthetic fracture left - PT evaluated, recommending SNF, family agreeable  ABLA  - post op - transfuse 1U pRBC 3/9, appropriate response, monitor - mild hypotension 3/9, resolved with fluids  Elevated CBG on admission - prediabetes based on A1C - Metformin on d/c  UTI - cultures pending, Ceftriaxone  Heart murmur  - 2D echo pending   Diet: DIET SOFT Diet - low sodium heart healthy Fluids: NS DVT Prophylaxis: Lovenox  Code Status: Full Code Family Communication: none bedside  Disposition Plan: remain inpatient, SNF 1-2 days based on blood counts  Consultants:  Orthopedic surgery   Procedures:  ORIF left femur 3/8   Antibiotics  Anti-infectives    Start     Dose/Rate Route Frequency Ordered Stop   05/24/14 0100  cefTRIAXone (ROCEPHIN) 1 g in dextrose 5 % 50 mL IVPB - Premix     1 g 100 mL/hr over 30 Minutes Intravenous Daily at bedtime 05/24/14 0018     05/23/14 0900  ceFAZolin (ANCEF) IVPB 2 g/50 mL premix     2 g 100 mL/hr over 30 Minutes Intravenous  Once 05/23/14 0848 05/23/14 0937       Studies  Pelvis Portable  05/23/2014   CLINICAL DATA:  Postoperative radiographs.  LEFT femur fracture.  EXAM:  PORTABLE PELVIS 1-2 VIEWS; LEFT FEMUR 2 VIEWS  COMPARISON:  02/22/2014.  05/23/2014 intraoperative fluoroscopy.  FINDINGS: LEFT total hip arthroplasty appears unchanged. Severe RIGHT hip osteoarthritis incidentally noted with complete loss of the joint space. Lateral buttress plate with encircling cables fixing a distal femur fracture. Expected postsurgical changes in the soft tissues. No hardware complicating features. One of the lag screws protrudes through the medial femoral condyle near the adductor tubercle.  IMPRESSION: Complex ORIF of distal femur fracture.   Electronically Signed   By: [name] M.D.   On: 05/23/2014 14:36   Dg Femur Min 2 Views Left  05/23/2014   CLINICAL DATA:  Postoperative radiographs.  LEFT femur fracture.  EXAM: PORTABLE PELVIS 1-2 VIEWS; LEFT FEMUR 2 VIEWS  COMPARISON:  02/22/2014.  05/23/2014 intraoperative fluoroscopy.  FINDINGS: LEFT total hip arthroplasty appears unchanged. Severe RIGHT hip osteoarthritis incidentally noted with complete loss of the joint space. Lateral buttress plate with encircling cables fixing a distal femur fracture. Expected postsurgical changes in the soft tissues. No hardware complicating features. One of the lag screws protrudes through the medial femoral condyle near the adductor tubercle.  IMPRESSION: Complex ORIF of distal femur fracture.   Electronically Signed   By: [name] M.D.   On: 05/23/2014 14:36    Objective  Filed Vitals:   05/25/14 0518 05/25/14 0603 05/25/14 0800 05/25/14 1200  BP:  103/49  Pulse: 91 95    Temp:  98 F (36.7 C)    TempSrc:  Oral    Resp:  18 18 18   Height:      Weight:      SpO2: 97% 97% 97% 96%    Intake/Output Summary (Last 24 hours) at 05/25/14 1424 Last data filed at 05/25/14 0518  Gross per 24 hour  Intake 1651.25 ml  Output      0 ml  Net 1651.25 ml   Filed Weights   05/22/14 1939  Weight: 63.504 kg (140 lb)    Exam:  General:  NAD  HEENT: no scleral  icterus  Cardiovascular: RRR, 3/6 SEM, good pulses, no edems  Respiratory: CTA biL  Abdomen: soft, non tender  MSK/Extremities: no clubbing/cyanosis  Skin: no rashes   Neuro: non focal   Data Reviewed: Basic Metabolic Panel:  Recent Labs Lab 05/22/14 2101 05/23/14 2140 05/24/14 0620 05/25/14 0800  NA 137  --  137 137  K 3.6  --  3.5 3.2*  CL 102  --  105 107  CO2 22  --  27 24  GLUCOSE 111*  --  115* 129*  BUN 24*  --  11 8  CREATININE 1.08 0.71 0.72 0.66  CALCIUM 9.5  --  8.2* 7.8*   Liver Function Tests:  Recent Labs Lab 05/24/14 0620  AST 34  ALT 20  ALKPHOS 43  BILITOT 1.0  PROT 5.5*  ALBUMIN 2.8*   CBC:  Recent Labs Lab 05/22/14 2101 05/23/14 2140 05/24/14 0620 05/25/14 0800  WBC 10.4 8.0 9.8 11.2*  NEUTROABS 8.1*  --   --   --   HGB 12.2 8.7* 7.8* 8.3*  HCT 36.3 26.4* 24.0* 25.8*  MCV 91.0 91.3 92.7 92.8  PLT 206 156 140* 141*   CBG:  Recent Labs Lab 05/24/14 1203 05/24/14 1656 05/24/14 2122 05/25/14 0632 05/25/14 1134  GLUCAP 135* 171* 124* 137* 110*    Recent Results (from the past 240 hour(s))  Surgical pcr screen     Status: None   Collection Time: 05/23/14  5:18 AM  Result Value Ref Range Status   MRSA, PCR NEGATIVE NEGATIVE Final   Staphylococcus aureus NEGATIVE NEGATIVE Final    Comment:        The Xpert SA Assay (FDA approved for NASAL specimens in patients over 79 years of age), is one component of a comprehensive surveillance program.  Test performance has been validated by [facility] for patients greater than or equal to 79 year old. It is not intended to diagnose infection nor to guide or monitor treatment.      Scheduled Meds: . sodium chloride   Intravenous Once  . acetaminophen  1,000 mg Oral 3 times per day   Or  . acetaminophen  650 mg Rectal 3 times per day  . antiseptic oral rinse  7 mL Mouth Rinse BID  . calcium-vitamin D  1 tablet Oral BID WC  . cefTRIAXone (ROCEPHIN)  IV  1 g Intravenous  QHS  . docusate sodium  100 mg Oral BID  . enoxaparin (LOVENOX) injection  40 mg Subcutaneous Q24H  . feeding supplement (ENSURE COMPLETE)  237 mL Oral TID BM  . insulin aspart  0-15 Units Subcutaneous TID WC  . senna  1 tablet Oral BID  . sodium chloride  250 mL Intravenous Once   Continuous Infusions:   Kathryn Pertostin Gherghe, MD [facility] Pager [phone]. If 7 PM - 7 AM, please contact night-coverage  at [email or web address], password [facility] 05/25/2014, 2:24 PM  LOS: 2 days

## 2014-05-25 NOTE — Progress Notes (Signed)
   Subjective:  Patient reports pain as mild.  Denies N/V/CP/SOB. Got 1 unit PRBCs yesterday. Received IVF bolus o/n for mild hypotension; straight cathed.  Objective:   VITALS:   Filed Vitals:   05/25/14 0005 05/25/14 0342 05/25/14 0518 05/25/14 0603  BP: 90/43   103/49  Pulse: 95  91 95  Temp: 98.4 F (36.9 C)   98 F (36.7 C)  TempSrc: Oral   Oral  Resp:  16  18  Height:      Weight:      SpO2: 99% 100% 97% 97%    ABD soft Sensation intact distally Intact pulses distally Dorsiflexion/Plantar flexion intact Incision: dressing C/D/I Compartment soft   Lab Results  Component Value Date   WBC 9.8 05/24/2014   HGB 7.8* 05/24/2014   HCT 24.0* 05/24/2014   MCV 92.7 05/24/2014   PLT 140* 05/24/2014   BMET    Component Value Date/Time   NA 137 05/24/2014 0620   K 3.5 05/24/2014 0620   CL 105 05/24/2014 0620   CO2 27 05/24/2014 0620   GLUCOSE 115* 05/24/2014 0620   BUN 11 05/24/2014 0620   CREATININE 0.72 05/24/2014 0620   CALCIUM 8.2* 05/24/2014 0620   GFRNONAA 76* 05/24/2014 0620   GFRAA 88* 05/24/2014 0620     Assessment/Plan: 2 Days Post-Op   Principal Problem:   Peri-prosthetic fracture of femur following total hip arthroplasty Active Problems:   Femur fracture, left   Fall at home   Undiagnosed cardiac murmurs   FTT (failure to thrive) in adult   NWB LLE PT/OT: ROM L knee, bed --> chair xfers UTI per primary team DVT ppx: lovenox x30 days, SCDs, TEDs Acute blood loss anemia: received 1 unit PRBCs yesterday; am labs pending Dispo: SNF placement   Maron Stanzione, Cloyde ReamsBrian James 05/25/2014, 6:42 AM   Samson FredericBrian Sayler Mickiewicz, MD Cell [phone]

## 2014-05-25 NOTE — Progress Notes (Addendum)
NUTRITION FOLLOW UP  INTERVENTION: Chief Operating Officer.  Provide Ensure Complete po TID, each supplement provides 350 kcal and 13 grams of protein.  Encourage adequate PO intake.  NUTRITION DIAGNOSIS: Increased nutrient needs related to s/p surgery as evidenced by estimated nutrition needs; ongoing  Goal: Pt to meet >/= 90% of their estimated nutrition needs; Not met  Monitor:  PO intake, weight trends, labs, I/O's  81 y.o. female  Admitting Dx: Peri-prosthetic fracture of femur following total hip arthroplasty  ASSESSMENT: Pt s/p fall with signficant DJD of the knee with distal femur fracture. Pt presents with a femur fracture. Patient was walking from her bedroom to the kitchen and took a fall.  Procedure(3/8): OPEN REDUCTION INTERNAL FIXATION (ORIF) PERIPROSTHETIC FRACTURE (Left)   Pt reports having a fair appetite. Meal completion has been 25-50%. Pt has been drinking her [employer], however reports she does not like it and would like to try an alternative supplement. Pt is agreeable to Ensure. RD to order. Pt was encouraged to eat her food at meals and to drink her supplements.   Nutrition Focused Physical Exam:  Subcutaneous Fat:  Orbital Region: N/A Upper Arm Region: WNL Thoracic and Lumbar Region: N/A  Muscle:  Temple Region: N/A Clavicle Bone Region: Moderate depletion Clavicle and Acromion Bone Region: Moderate depletion Scapular Bone Region: N/A Dorsal Hand: N/A Patellar Region: WNL Anterior Thigh Region: WNL Posterior Calf Region: WNL  Edema: non-pitting LLE   Labs: Low potassium, calcium, and GFR.  Height: Ht Readings from Last 1 Encounters:  05/22/14 5' 3"  (1.6 m)    Weight: Wt Readings from Last 1 Encounters:  05/22/14 140 lb (63.504 kg)    BMI:  Body mass index is 24.81 kg/(m^2).  Re-Estimated Nutritional Needs: Kcal: 1650-1900 Protein: 80-90 grams Fluid: 1.7 - 2L/day  Skin: Incision on L hip, non-pitting LLE  edema  Diet Order: DIET SOFT Diet - low sodium heart healthy   Intake/Output Summary (Last 24 hours) at 05/25/14 1208 Last data filed at 05/25/14 0518  Gross per 24 hour  Intake 2106.25 ml  Output      0 ml  Net 2106.25 ml    Last BM: 3/6  Labs:   Recent Labs Lab 05/22/14 2101 05/23/14 2140 05/24/14 0620 05/25/14 0800  NA 137  --  137 137  K 3.6  --  3.5 3.2*  CL 102  --  105 107  CO2 22  --  27 24  BUN 24*  --  11 8  CREATININE 1.08 0.71 0.72 0.66  CALCIUM 9.5  --  8.2* 7.8*  GLUCOSE 111*  --  115* 129*    CBG (last 3)   Recent Labs  05/24/14 2122 05/25/14 0632 05/25/14 1134  GLUCAP 124* 137* 110*    Scheduled Meds: . sodium chloride   Intravenous Once  . acetaminophen  1,000 mg Oral 3 times per day   Or  . acetaminophen  650 mg Rectal 3 times per day  . antiseptic oral rinse  7 mL Mouth Rinse BID  . calcium-vitamin D  1 tablet Oral BID WC  . cefTRIAXone (ROCEPHIN)  IV  1 g Intravenous QHS  . docusate sodium  100 mg Oral BID  . enoxaparin (LOVENOX) injection  40 mg Subcutaneous Q24H  . feeding supplement (ENSURE COMPLETE)  237 mL Oral TID BM  . insulin aspart  0-15 Units Subcutaneous TID WC  . senna  1 tablet Oral BID  . sodium chloride  250 mL Intravenous Once  Continuous Infusions:    History reviewed. No pertinent past medical history.  Past Surgical History  Procedure Laterality Date  . Joint replacement      Kallie Locks, MS, RD, LDN Pager # [phone] After hours/ weekend pager # [phone]

## 2014-05-25 NOTE — Clinical Social Work Note (Signed)
CSW met with patient and patient's son, Evelena Peat at bedside to review bed offers.  Patient has accepted the bed offer from [facility], SNF and will be transported via [facility] at time of discharge.  Disposition: SNF- [facility] [phone] via Cyndi Lennert, [place] [phone]  Psychiatric & Orthopedics (5N 1-16) Clinical Social Worker

## 2014-05-25 NOTE — Clinical Social Work Placement (Addendum)
Clinical Social Work Department CLINICAL SOCIAL WORK PLACEMENT NOTE 05/25/2014  Patient:  [name],[name]  Account Number:  [id number] Admit date:  05/22/2014  Clinical Social Worker:  Read DriversEGINA INGLE, LCSWA  Date/time:  05/25/2014 11:01 AM  Clinical Social Work is seeking post-discharge placement for this patient at the following level of care:   SKILLED NURSING   (*CSW will update this form in Epic as items are completed)   05/25/2014  Patient/family provided with [facility] Department of Clinical Social Work's list of facilities offering this level of care within the geographic area requested by the patient (or if unable, by the patient's family).  05/25/2014  Patient/family informed of their freedom to choose among providers that offer the needed level of care, that participate in Medicare, Medicaid or managed care program needed by the patient, have an available bed and are willing to accept the patient.  05/25/2014  Patient/family informed of [facility]' ownership interest in [facility], as well as of the fact that they are under no obligation to receive care at this facility.  PASARR submitted to EDS on 05/25/2014 PASARR number received on 05/25/2014  FL2 transmitted to all facilities in geographic area requested by pt/family on  05/25/2014 FL2 transmitted to all facilities within larger geographic area on   Patient informed that his/her managed care company has contracts with or will negotiate with  certain facilities, including the following:     Patient/family informed of bed offers received:  05/25/2014 Patient chooses bed at [facility] Physician recommends and patient chooses bed at    Patient to be transferred to [facility] on 05/27/2014  Patient to be transferred to facility by [facility]-Sera Hitsman Patrick-Jefferson, LCSWA, DP Patient and family notified of transfer on 05/27/2014 Name of family member notified:  [name]  The following physician  request were entered in Epic:   Additional Comments:   Vickii PennaGina Ingle, LCSWA [phone]  Psychiatric & Orthopedics (5N 1-16) Clinical Social Worker

## 2014-05-26 DIAGNOSIS — W19XXXA Unspecified fall, initial encounter: Secondary | ICD-10-CM

## 2014-05-26 DIAGNOSIS — R627 Adult failure to thrive: Secondary | ICD-10-CM

## 2014-05-26 DIAGNOSIS — R010 Benign and innocent cardiac murmurs: Secondary | ICD-10-CM

## 2014-05-26 DIAGNOSIS — Y92099 Unspecified place in other non-institutional residence as the place of occurrence of the external cause: Secondary | ICD-10-CM

## 2014-05-26 DIAGNOSIS — T84048A Periprosthetic fracture around other internal prosthetic joint, initial encounter: Secondary | ICD-10-CM

## 2014-05-26 LAB — GLUCOSE, CAPILLARY
GLUCOSE-CAPILLARY: 99 mg/dL (ref 70–99)
GLUCOSE-CAPILLARY: 111 mg/dL — AB (ref 70–99)
Glucose-Capillary: 98 mg/dL (ref 70–99)
Glucose-Capillary: 136 mg/dL — ABNORMAL HIGH (ref 70–99)

## 2014-05-26 LAB — BASIC METABOLIC PANEL
Anion gap: 9 (ref 5–15)
BUN: 7 mg/dL (ref 6–23)
CO2: 23 mmol/L (ref 19–32)
Calcium: 8.3 mg/dL — ABNORMAL LOW (ref 8.4–10.5)
Chloride: 106 mmol/L (ref 96–112)
Creatinine, Ser: 0.72 mg/dL (ref 0.50–1.10)
GFR, EST AFRICAN AMERICAN: 88 mL/min — AB (ref >90)
GFR, EST NON AFRICAN AMERICAN: 76 mL/min — AB (ref >90)
Glucose, Bld: 100 mg/dL — ABNORMAL HIGH (ref 70–99)
POTASSIUM: 3.2 mmol/L — AB (ref 3.5–5.1)
SODIUM: 138 mmol/L (ref 135–145)

## 2014-05-26 LAB — URINE CULTURE
COLONY COUNT: NO GROWTH
Culture: NO GROWTH

## 2014-05-26 LAB — CBC
HCT: 26.6 % — ABNORMAL LOW (ref 36.0–46.0)
HEMOGLOBIN: 8.8 g/dL — AB (ref 12.0–15.0)
MCH: 30.3 pg (ref 26.0–34.0)
MCHC: 33.1 g/dL (ref 30.0–36.0)
MCV: 91.7 fL (ref 78.0–100.0)
Platelets: 172 10*3/uL (ref 150–400)
RBC: 2.9 MIL/uL — ABNORMAL LOW (ref 3.87–5.11)
RDW: 14.7 % (ref 11.5–15.5)
WBC: 11.3 10*3/uL — ABNORMAL HIGH (ref 4.0–10.5)

## 2014-05-26 MED ORDER — CALCIUM CARBONATE-VITAMIN D 500-200 MG-UNIT PO TABS: 1.0000 | ORAL_TABLET | Freq: Two times a day (BID) | ORAL | Status: AC

## 2014-05-26 MED ORDER — ENSURE COMPLETE PO LIQD: 237.0000 mL | Freq: Three times a day (TID) | ORAL | Status: DC

## 2014-05-26 MED ORDER — METFORMIN HCL 500 MG PO TABS: 500.0000 mg | ORAL_TABLET | Freq: Two times a day (BID) | ORAL | Status: DC

## 2014-05-26 MED ORDER — SENNOSIDES-DOCUSATE SODIUM 8.6-50 MG PO TABS: 1.0000 | ORAL_TABLET | Freq: Every day | ORAL | Status: DC

## 2014-05-26 MED ORDER — POTASSIUM CHLORIDE CRYS ER 20 MEQ PO TBCR
40.0000 meq | EXTENDED_RELEASE_TABLET | Freq: Once | ORAL | Status: AC
  Administered 2014-05-26: 40 meq via ORAL
  Filled 2014-05-26: qty 2

## 2014-05-26 NOTE — Progress Notes (Signed)
CARE MANAGEMENT NOTE 05/26/2014  Patient:  [name],[name]   Account Number:  [id number]  Date Initiated:  05/25/2014  Documentation initiated by:  ROBARGE,COURTNEY  Subjective/Objective Assessment:   Patient was admitted for a OPEN REDUCTION INTERNAL FIXATION (ORIF) PERIPROSTHETIC FRACTURE s/p/fall.     Action/Plan:   PT/OT evals-recommended SNF   Anticipated DC Date:  05/26/2014   Anticipated DC Plan:  SKILLED NURSING FACILITY  In-house referral  Clinical Social Worker      DC Planning Services  CM consult               Status of service:  Completed, signed off Medicare Important Message given?  YES Date Medicare IM given:  05/26/2014 Medicare IM given by:  [facility]Darlin Stens[facility]  Discharge Disposition:  SKILLED NURSING FACILITY  Per UR Regulation:  Reviewed for med. necessity/level of care/duration of stay

## 2014-05-26 NOTE — Care Management Note (Signed)
CARE MANAGEMENT NOTE 05/26/2014  Patient:  Sandrea HughsKELLUM,Micala J   Account Number:  [id number]  Date Initiated:  05/25/2014  Documentation initiated by:  ROBARGE,COURTNEY  Subjective/Objective Assessment:   Patient was admitted for a OPEN REDUCTION INTERNAL FIXATION (ORIF) PERIPROSTHETIC FRACTURE s/p/fall.     Action/Plan:   PT/OT evals-recommended SNF   Anticipated DC Date:  05/26/2014   Anticipated DC Plan:  SKILLED NURSING FACILITY  In-house referral  Clinical Social Worker      DC Planning Services  CM consult      Choice offered to / List presented to:             Status of service:  Completed, signed off Medicare Important Message given?  YES (If response is "NO", the following Medicare IM given date fields will be blank) Date Medicare IM given:  05/26/2014 Medicare IM given by:  [facility] Date Additional Medicare IM given:   Additional Medicare IM given by:    Discharge Disposition:  SKILLED NURSING FACILITY  Per UR Regulation:  Reviewed for med. necessity/level of care/duration of stay  If discussed at Long Length of Stay Meetings, dates discussed:    Comments:  05/26/14 1835 Meryl CrutchJeannette Oliveras, RN, BSN  Per RN, pt is ready to be d/[facility] to [facility]. Contacted SW and left a VM.

## 2014-05-26 NOTE — Progress Notes (Signed)
Physical Therapy Treatment Patient Details Name: Kathryn HughsRuth J Colgan MRN: [id number] DOB: 1925/09/30 Today's Date: 05/26/2014    History of Present Illness [name] is a 81 y.o. female presents with a femur fracture. Pt s/p L periprosthetic 3/8.    PT Comments    Patient performing LE exercises with assist.  Required repeated verbal cues for technique and to remain on task.  Follow Up Recommendations  SNF;Supervision/Assistance - 24 hour     Equipment Recommendations  None recommended by PT    Recommendations for Other Services       Precautions / Restrictions Precautions Precautions: Fall Restrictions Weight Bearing Restrictions: Yes LLE Weight Bearing: Non weight bearing    Mobility  Bed Mobility Overal bed mobility: Needs Assistance;+2 for physical assistance Bed Mobility: Rolling Rolling: Max assist;+2 for physical assistance         General bed mobility comments: Max assist to position for exercises  Transfers                    Ambulation/Gait                 Stairs            Wheelchair Mobility    Modified Rankin (Stroke Patients Only)       Balance                                    Cognition Arousal/Alertness: Lethargic;Suspect due to medications Behavior During Therapy: [facility] for tasks assessed/performed Overall Cognitive Status: Impaired/Different from baseline Area of Impairment: Attention;Following commands;Safety/judgement;Awareness;Problem solving   Current Attention Level: Sustained Memory: Decreased short-term memory;Decreased recall of precautions Following Commands: Follows one step commands inconsistently;Follows one step commands with increased time Safety/Judgement: Decreased awareness of safety;Decreased awareness of deficits   Problem Solving: Slow processing;Decreased initiation;Difficulty sequencing;Requires verbal cues      Exercises General Exercises - Lower Extremity Ankle  Circles/Pumps: AROM;Both;15 reps;Supine Quad Sets: AAROM;Right;10 reps;Left;5 reps;Supine Short Arc Quad: AAROM;Right;10 reps;Left;5 reps;Supine Heel Slides: AAROM;Both;10 reps;Supine Straight Leg Raises: Right;AAROM;5 reps;Supine    General Comments        Pertinent Vitals/Pain Pain Assessment: Faces Faces Pain Scale: Hurts a little bit Pain Location: LLE Pain Descriptors / Indicators: Sore Pain Intervention(s): Monitored during session;Repositioned;RN gave pain meds during session    Home Living                      Prior Function            PT Goals (current goals can now be found in the care plan section) Progress towards PT goals: Progressing toward goals    Frequency  Min 2X/week    PT Plan Current plan remains appropriate    Co-evaluation             End of Session   Activity Tolerance: Patient tolerated treatment well;Patient limited by fatigue Patient left: in bed;with call bell/phone within reach     Time: [id number] PT Time Calculation (min) (ACUTE ONLY): 12 min  Charges:  $Therapeutic Exercise: 8-22 mins                    G Codes:      Vena AustriaDavis, Caleah Tortorelli H 05/26/2014, 2:34 PM Durenda HurtSusan H. Renaldo Fiddleravis, PT, [facility] Acute Rehab Services Pager [phone]

## 2014-05-26 NOTE — Progress Notes (Signed)
Report was given to this RN at 1600 that patient was to be discharged and she is waiting on non-emergent EMS.  Dr Roda ShuttersXu called and asked why patient was not discharged at 1830, I then notified CM who was on unit for status.  Chart , checked and pertinent papers for patient that is required by SNF are not in chart.    SW called/paged, no return call so far, second shift RN notified along with CRN.  MD paged to next RN coming on shift.

## 2014-05-26 NOTE — Discharge Summary (Addendum)
Discharge Summary   Addendum 3/12. Patient unable to be transported last night, no changes, continue with discharge, summary as below per Dr. Roda ShuttersXu.   Stevphen Meuseostin M. Elvera LennoxGherghe, MD   Orvan SeenRuth J Corsi ZOX:[id number] DOB: Mar 16, 1926  PCP: Catalina PizzaHALL, ZACH, MD  Admit date: 05/22/2014 Discharge date: 05/27/2014  Time spent: >3530mins  Recommendations for Outpatient Follow-up:  1. F/u with pcp in two weeks to repeat cbc/bmp. pcp to follow up on echocardiogram report. 2. F/u with orthopedics  Discharge Diagnoses:  Active Hospital Problems   Diagnosis Date Noted  . Peri-prosthetic fracture of femur following total hip arthroplasty 05/23/2014  . Femur fracture, left 05/23/2014  . Fall at home   . Undiagnosed cardiac murmurs   . FTT (failure to thrive) in adult     Resolved Hospital Problems   Diagnosis Date Noted Date Resolved  No resolved problems to display.    Discharge Condition: stable  Diet recommendation: soft diet /heart health/carb modified  Filed Weights   05/22/14 1939  Weight: 63.504 kg (140 lb)    History of present illness:  [name] is a 81 y.o. female presents with a femur fracture. Patient was walking from her bedroom to the kitchen and took a fall. Her son states that he got a call around 530PM He states that she told him she heard something pop as though something broke. Patient did not lose consciousness. She had no complaints of chest pain. She does have pain in her left leg. She states that she did not trip over anything. She has a prior fracture of the hip about 17 years ago. This was replaced at that time. She has no other specific complaints.    Hospital Course:  Principal Problem:   Peri-prosthetic fracture of femur following total hip arthroplasty Active Problems:   Femur fracture, left   Fall at home   Undiagnosed cardiac murmurs   FTT (failure to thrive) in adult Left femoral fracture - s/p ORIF periprosthetic fracture left - PT evaluated,  SNF, family  agreeable [facility] LLE PT/OT: ROM L knee, bed --> chair xfers DVT ppx: lovenox x30 days, SCDs, TEDs  ABLA  - post op - transfuse 1U pRBC 3/9, appropriate response, monitor - mild hypotension 3/9, resolved with fluids -h/h stable at discharge, pcp to repeat cbc   Elevated CBG on admission - prediabetes based on A1C 6.2 -low dose Metformin prescribed at discharge, pcp to monitor bmp in two weeks.  UTI - cultures negative, received Ceftriaxone in the hospital. Not discharged on abx.  Heart murmur  - 2D echo result pending, pcp to follow up on result.  Early dementia: mild, easily reoriented.  vitD deficiency, vitd supplement prescribed at discharge.  Consultants:  Orthopedic surgery  Procedures:  ORIF left femur 3/8   Antibiotics  Anti-infectives    Start   Dose/Rate Route Frequency Ordered Stop   05/24/14 0100  cefTRIAXone (ROCEPHIN) 1 g in dextrose 5 % 50 mL IVPB - Premix    1 g 100 mL/hr over 30 Minutes Intravenous Daily at bedtime 05/24/14 0018    05/23/14 0900  ceFAZolin (ANCEF) IVPB 2 g/50 mL premix    2 g 100 mL/hr over 30 Minutes Intravenous Once 05/23/14 0848 05/23/14 0937       Discharge Exam: BP 106/47 mmHg  Pulse 91  Temp(Src) 98.3 F (36.8 C) (Oral)  Resp 20  Ht 5\' 3"  (1.6 m)  Wt 63.504 kg (140 lb)  BMI 24.81 kg/m2  SpO2 95%   General: NAD  HEENT: no scleral icterus  Cardiovascular: RRR, 3/6 SEM, good pulses, no edems  Respiratory: CTA biL  Abdomen: soft, non tender  MSK/Extremities: no clubbing/cyanosis  Skin: no rashes   Neuro: non focal, does seems to have early dementia, but easily reoriented.   Discharge Instructions You were cared for by a hospitalist during your hospital stay. If you have any questions about your discharge medications or the care you received while you were in the hospital after you are discharged, you can call the unit and asked to speak with the hospitalist on call if  the hospitalist that took care of you is not available. Once you are discharged, your primary care physician will handle any further medical issues. Please note that NO REFILLS for any discharge medications will be authorized once you are discharged, as it is imperative that you return to your primary care physician (or establish a relationship with a primary care physician if you do not have one) for your aftercare needs so that they can reassess your need for medications and monitor your lab values.      Discharge Instructions    Call MD / Call 911    Complete by:  As directed   If you experience chest pain or shortness of breath, CALL 911 and be transported to the hospital emergency room.  If you develope a fever above 101 F, pus (white drainage) or increased drainage or redness at the wound, or calf pain, call your surgeon's office.     Constipation Prevention    Complete by:  As directed   Drink plenty of fluids.  Prune juice may be helpful.  You may use a stool softener, such as Colace (over the counter) 100 mg twice a day.  Use MiraLax (over the counter) for constipation as needed.     DO NOT drive, shower or take a tub bath until instructed by your physician    Complete by:  As directed      Diet - low sodium heart healthy    Complete by:  As directed      Diet - low sodium heart healthy    Complete by:  As directed      Discharge wound care:    Complete by:  As directed   Do not remove your surgical dressing. The dressing is waterproof, and you may take showers. No tub baths or soaking of the dressing.     Do not sit on low chairs, stoools or toilet seats, as it may be difficult to get up from low surfaces    Complete by:  As directed      Increase activity slowly as tolerated    Complete by:  As directed      Increase activity slowly    Complete by:  As directed             Medication List    STOP taking these medications        acetaminophen 500 MG tablet  Commonly known  as:  TYLENOL      TAKE these medications        calcium-vitamin D 500-200 MG-UNIT per tablet  Commonly known as:  OSCAL WITH D  Take 1 tablet by mouth 2 (two) times daily with a meal.     enoxaparin 40 MG/0.4ML injection  Commonly known as:  LOVENOX  Inject 0.4 mLs (40 mg total) into the skin daily.     feeding supplement (ENSURE COMPLETE) Liqd  Take 237 mLs  by mouth 3 (three) times daily between meals.     FLEX-A-MIN JOINT FLEX Tabs  Take 1 tablet by mouth daily.     metFORMIN 500 MG tablet  Commonly known as:  GLUCOPHAGE  Take 1 tablet (500 mg total) by mouth 2 (two) times daily with a meal.     senna-docusate 8.6-50 MG per tablet  Commonly known as:  SENOKOT S  Take 1 tablet by mouth at bedtime.     traMADol 50 MG tablet  Commonly known as:  ULTRAM  Take 0.5 tablets (25 mg total) by mouth every 6 (six) hours as needed for moderate pain.     Turmeric Curcumin 500 MG Caps  Take 1 capsule by mouth daily as needed (for pain).       Allergies  Allergen Reactions  . Codeine Nausea And Vomiting  . Penicillins Nausea And Vomiting   Follow-up Information    Follow up with Swinteck, Cloyde Reams, MD. Schedule an appointment as soon as possible for a visit in 2 weeks.   Specialty:  Orthopedic Surgery   Why:  For wound re-check, For suture removal   Contact information:   [address] [place] [address] [phone]       Follow up with Catalina Pizza, MD In 2 weeks.   Specialty:  Internal Medicine   Why:  pcp to repeat cbc/bmp in two weeks, pcp to follow up on echocardiogram result.   Contact information:    [address]  [place] [address] [phone]        The results of significant diagnostics from this hospitalization (including imaging, microbiology, ancillary and laboratory) are listed below for reference.    Significant Diagnostic Studies: Dg Chest 1 View  05/22/2014   CLINICAL DATA:  Status post fall.  Left hip pain.  EXAM: CHEST   1 VIEW  COMPARISON:  None.  FINDINGS: There is elevation of the right hemidiaphragm relative to the left. The patient is rotated on the study. Lungs appear clear. Heart size is normal. No pneumothorax or pleural effusion. Advanced degenerative disease right shoulder is noted.  IMPRESSION: No acute cardiopulmonary disease.   Electronically Signed   By: Drusilla Kanner M.D.   On: 05/22/2014 20:42   Pelvis Portable  05/23/2014   CLINICAL DATA:  Postoperative radiographs.  LEFT femur fracture.  EXAM: PORTABLE PELVIS 1-2 VIEWS; LEFT FEMUR 2 VIEWS  COMPARISON:  02/22/2014.  05/23/2014 intraoperative fluoroscopy.  FINDINGS: LEFT total hip arthroplasty appears unchanged. Severe RIGHT hip osteoarthritis incidentally noted with complete loss of the joint space. Lateral buttress plate with encircling cables fixing a distal femur fracture. Expected postsurgical changes in the soft tissues. No hardware complicating features. One of the lag screws protrudes through the medial femoral condyle near the adductor tubercle.  IMPRESSION: Complex ORIF of distal femur fracture.   Electronically Signed   By: Andreas Newport M.D.   On: 05/23/2014 14:36   Ct Femur Left Wo Contrast  05/23/2014   CLINICAL DATA:  Left femur fracture after fall.  EXAM: CT OF THE LEFT FEMUR WITHOUT CONTRAST  TECHNIQUE: Multidetector CT imaging was performed according to the standard protocol. Multiplanar CT image reconstructions were also generated.  COMPARISON:  Radiographs 1 day prior.  FINDINGS: Displaced comminuted mildly impacted distal femur fracture with less than 1 cm medial displacement distal fracture fragment. There is less than 2 cm of osseous overriding. Anterior laterally of the fracture line approaches but does not definitively involve the patellofemoral articulation.  There is no extension distally to involve the tibial femoral articular surface or intercondylar notch. There is moderate tricompartmental osteoarthritis of the knee. The more  proximal femur is intact. Total hip arthroplasty without periprosthetic fracture. There is a small knee joint effusion. Soft tissue edema is seen about the fracture site.  IMPRESSION: Displaced comminuted mildly impacted distal femur fracture. No definite intra-articular extension.   Electronically Signed   By: Rubye Oaks M.D.   On: 05/23/2014 03:23   Dg C-arm 61-120 Min  05/23/2014   CLINICAL DATA:  ORIF of periprosthetic left femur fracture  EXAM: DG C-ARM 61-120 MIN; LEFT FEMUR 2 VIEWS  FLUOROSCOPY TIME:  Fluoroscopy Time (in minutes and seconds): 2 minutes 47 seconds  Number of Acquired Images:  7  COMPARISON:  CT left femur dated 05/23/2014  FINDINGS: Intraoperative fluoroscopic spot images during lateral compression plate and cerclage wire fixation of a periprosthetic left femur fracture.  IMPRESSION: Intraoperative fluoroscopic spot images during ORIF of a periprosthetic left femur fracture, as above.   Electronically Signed   By: Charline Bills M.D.   On: 05/23/2014 13:25   Dg Hip Unilat With Pelvis 2-3 Views Left  05/22/2014   CLINICAL DATA:  Status post fall with pain all lower left hip.  EXAM: LEFT HIP (WITH PELVIS) 2-3 VIEWS  COMPARISON:  None.  FINDINGS: A left hip replacement is identified. There is no dislocation. There is no acute fracture or dislocation. There are severe it degenerative joint changes of right hip.  IMPRESSION: Left hip replacement without dislocation. No acute fracture or dislocation is identified.   Electronically Signed   By: Sherian Rein M.D.   On: 05/22/2014 20:42   Dg Femur Min 2 Views Left  05/23/2014   CLINICAL DATA:  Postoperative radiographs.  LEFT femur fracture.  EXAM: PORTABLE PELVIS 1-2 VIEWS; LEFT FEMUR 2 VIEWS  COMPARISON:  02/22/2014.  05/23/2014 intraoperative fluoroscopy.  FINDINGS: LEFT total hip arthroplasty appears unchanged. Severe RIGHT hip osteoarthritis incidentally noted with complete loss of the joint space. Lateral buttress plate with  encircling cables fixing a distal femur fracture. Expected postsurgical changes in the soft tissues. No hardware complicating features. One of the lag screws protrudes through the medial femoral condyle near the adductor tubercle.  IMPRESSION: Complex ORIF of distal femur fracture.   Electronically Signed   By: Andreas Newport M.D.   On: 05/23/2014 14:36   Dg Femur Min 2 Views Left  05/23/2014   CLINICAL DATA:  ORIF of periprosthetic left femur fracture  EXAM: DG C-ARM 61-120 MIN; LEFT FEMUR 2 VIEWS  FLUOROSCOPY TIME:  Fluoroscopy Time (in minutes and seconds): 2 minutes 47 seconds  Number of Acquired Images:  7  COMPARISON:  CT left femur dated 05/23/2014  FINDINGS: Intraoperative fluoroscopic spot images during lateral compression plate and cerclage wire fixation of a periprosthetic left femur fracture.  IMPRESSION: Intraoperative fluoroscopic spot images during ORIF of a periprosthetic left femur fracture, as above.   Electronically Signed   By: Charline Bills M.D.   On: 05/23/2014 13:25   Dg Femur Min 2 Views Left  05/22/2014   CLINICAL DATA:  Left upper leg pain after a fall at home today. Initial encounter.  EXAM: LEFT FEMUR 2 VIEWS  COMPARISON:  None.  FINDINGS: The patient has an acute fracture of the distal femur. Superior margin of the fracture is identified 23 cm above the intercondylar notch. The fracture extends distally through the metaphysis and likely into the intercondylar femur although not well  demonstrated on this study. Associated soft tissue swelling is noted. Bones are osteopenic. Right hip replacement is in place.  IMPRESSION: Acute distal femur fracture as described.   Electronically Signed   By: Drusilla Kanner M.D.   On: 05/22/2014 22:23    Microbiology: Recent Results (from the past 240 hour(s))  Surgical pcr screen     Status: None   Collection Time: 05/23/14  5:18 AM  Result Value Ref Range Status   MRSA, PCR NEGATIVE NEGATIVE Final   Staphylococcus aureus NEGATIVE  NEGATIVE Final    Comment:        The Xpert SA Assay (FDA approved for NASAL specimens in patients over 4 years of age), is one component of a comprehensive surveillance program.  Test performance has been validated by [facility] for patients greater than or equal to 51 year old. It is not intended to diagnose infection nor to guide or monitor treatment.   Culture, Urine     Status: None   Collection Time: 05/23/14  8:38 PM  Result Value Ref Range Status   Specimen Description URINE, RANDOM  Final   Special Requests ADDED [id number]  Final   Colony Count NO GROWTH Performed at [facility]   Final   Culture NO GROWTH Performed at [facility]   Final   Report Status 05/26/2014 FINAL  Final     Labs: Basic Metabolic Panel:  Recent Labs Lab 05/22/14 2101 05/23/14 2140 05/24/14 0620 05/25/14 0800 05/26/14 0604  NA 137  --  137 137 138  K 3.6  --  3.5 3.2* 3.2*  CL 102  --  105 107 106  CO2 22  --  27 24 23   GLUCOSE 111*  --  115* 129* 100*  BUN 24*  --  11 8 7   CREATININE 1.08 0.71 0.72 0.66 0.72  CALCIUM 9.5  --  8.2* 7.8* 8.3*   Liver Function Tests:  Recent Labs Lab 05/24/14 0620  AST 34  ALT 20  ALKPHOS 43  BILITOT 1.0  PROT 5.5*  ALBUMIN 2.8*   No results for input(s): LIPASE, AMYLASE in the last 168 hours. No results for input(s): AMMONIA in the last 168 hours. CBC:  Recent Labs Lab 05/22/14 2101 05/23/14 2140 05/24/14 0620 05/25/14 0800 05/26/14 0604  WBC 10.4 8.0 9.8 11.2* 11.3*  NEUTROABS 8.1*  --   --   --   --   HGB 12.2 8.7* 7.8* 8.3* 8.8*  HCT 36.3 26.4* 24.0* 25.8* 26.6*  MCV 91.0 91.3 92.7 92.8 91.7  PLT 206 156 140* 141* 172    CBG:  Recent Labs Lab 05/25/14 0632 05/25/14 1134 05/25/14 1623 05/25/14 2137 05/26/14 0630  GLUCAP 137* 110* 105* 188* 98       Signed:  Xu,Fang  [facility] 05/26/2014, 9:52 AM

## 2014-05-26 NOTE — Progress Notes (Signed)
   Subjective:  Patient reports pain as mild.  Denies N/V/CP/SOB.  Objective:   VITALS:   Filed Vitals:   05/25/14 2302 05/26/14 0103 05/26/14 0355 05/26/14 0526  BP:    106/47  Pulse:  94  91  Temp:    98.3 F (36.8 C)  TempSrc:    Oral  Resp: 16 17 16 17   Height:      Weight:      SpO2: 94% 91% 94% 93%    ABD soft Sensation intact distally Intact pulses distally Dorsiflexion/Plantar flexion intact Incision: dressing C/D/I Compartment soft   Lab Results  Component Value Date   WBC 11.2* 05/25/2014   HGB 8.3* 05/25/2014   HCT 25.8* 05/25/2014   MCV 92.8 05/25/2014   PLT 141* 05/25/2014   BMET    Component Value Date/Time   NA 137 05/25/2014 0800   K 3.2* 05/25/2014 0800   CL 107 05/25/2014 0800   CO2 24 05/25/2014 0800   GLUCOSE 129* 05/25/2014 0800   BUN 8 05/25/2014 0800   CREATININE 0.66 05/25/2014 0800   CALCIUM 7.8* 05/25/2014 0800   GFRNONAA 78* 05/25/2014 0800   GFRAA >90 05/25/2014 0800     Assessment/Plan: 3 Days Post-Op   Principal Problem:   Peri-prosthetic fracture of femur following total hip arthroplasty Active Problems:   Femur fracture, left   Fall at home   Undiagnosed cardiac murmurs   FTT (failure to thrive) in adult   NWB LLE PT/OT: ROM L knee, bed --> chair xfers UTI per primary team DVT ppx: lovenox x30 days, SCDs, TEDs Acute blood loss anemia: stable, monitor Dispo: SNF placement   Bettye Sitton, Cloyde ReamsBrian James 05/26/2014, 6:38 AM   Samson FredericBrian Jaiven Graveline, MD Cell [phone]

## 2014-05-27 ENCOUNTER — Inpatient Hospital Stay
Admission: RE | Admit: 2014-05-27 | Discharge: 2014-09-04 | Disposition: A | Discharge status: Home / Self Care | Payer: Medicare Other | Source: Ambulatory Visit | Attending: Internal Medicine | Admitting: Internal Medicine

## 2014-05-27 DIAGNOSIS — R5082 Postprocedural fever: Secondary | ICD-10-CM | POA: Diagnosis not present

## 2014-05-27 DIAGNOSIS — Z9181 History of falling: Secondary | ICD-10-CM | POA: Diagnosis not present

## 2014-05-27 DIAGNOSIS — R488 Other symbolic dysfunctions: Secondary | ICD-10-CM | POA: Diagnosis not present

## 2014-05-27 DIAGNOSIS — T84043D Periprosthetic fracture around internal prosthetic left knee joint, subsequent encounter: Secondary | ICD-10-CM | POA: Diagnosis not present

## 2014-05-27 DIAGNOSIS — R279 Unspecified lack of coordination: Secondary | ICD-10-CM | POA: Diagnosis not present

## 2014-05-27 DIAGNOSIS — Z9889 Other specified postprocedural states: Secondary | ICD-10-CM | POA: Diagnosis not present

## 2014-05-27 DIAGNOSIS — S7290XA Unspecified fracture of unspecified femur, initial encounter for closed fracture: Secondary | ICD-10-CM | POA: Diagnosis not present

## 2014-05-27 DIAGNOSIS — T84048D Periprosthetic fracture around other internal prosthetic joint, subsequent encounter: Secondary | ICD-10-CM | POA: Diagnosis not present

## 2014-05-27 DIAGNOSIS — S7292XD Unspecified fracture of left femur, subsequent encounter for closed fracture with routine healing: Secondary | ICD-10-CM | POA: Diagnosis not present

## 2014-05-27 DIAGNOSIS — R05 Cough: Secondary | ICD-10-CM | POA: Diagnosis not present

## 2014-05-27 DIAGNOSIS — R1312 Dysphagia, oropharyngeal phase: Secondary | ICD-10-CM | POA: Diagnosis not present

## 2014-05-27 DIAGNOSIS — R531 Weakness: Secondary | ICD-10-CM | POA: Diagnosis not present

## 2014-05-27 DIAGNOSIS — J189 Pneumonia, unspecified organism: Secondary | ICD-10-CM | POA: Diagnosis not present

## 2014-05-27 DIAGNOSIS — R627 Adult failure to thrive: Secondary | ICD-10-CM | POA: Diagnosis not present

## 2014-05-27 DIAGNOSIS — M5030 Other cervical disc degeneration, unspecified cervical region: Secondary | ICD-10-CM | POA: Diagnosis not present

## 2014-05-27 DIAGNOSIS — M1990 Unspecified osteoarthritis, unspecified site: Secondary | ICD-10-CM | POA: Diagnosis not present

## 2014-05-27 DIAGNOSIS — M0549 Rheumatoid myopathy with rheumatoid arthritis of multiple sites: Secondary | ICD-10-CM | POA: Diagnosis not present

## 2014-05-27 DIAGNOSIS — F0390 Unspecified dementia without behavioral disturbance: Secondary | ICD-10-CM | POA: Diagnosis not present

## 2014-05-27 DIAGNOSIS — E119 Type 2 diabetes mellitus without complications: Secondary | ICD-10-CM | POA: Diagnosis not present

## 2014-05-27 DIAGNOSIS — R011 Cardiac murmur, unspecified: Secondary | ICD-10-CM | POA: Diagnosis not present

## 2014-05-27 DIAGNOSIS — J90 Pleural effusion, not elsewhere classified: Secondary | ICD-10-CM | POA: Diagnosis not present

## 2014-05-27 DIAGNOSIS — M6281 Muscle weakness (generalized): Secondary | ICD-10-CM | POA: Diagnosis not present

## 2014-05-27 DIAGNOSIS — R339 Retention of urine, unspecified: Secondary | ICD-10-CM | POA: Diagnosis not present

## 2014-05-27 LAB — GLUCOSE, CAPILLARY
GLUCOSE-CAPILLARY: 218 mg/dL — AB (ref 70–99)
Glucose-Capillary: 87 mg/dL (ref 70–99)

## 2014-05-27 NOTE — Progress Notes (Signed)
Subjective: 4 Days Post-Op Procedure(s) (LRB): OPEN REDUCTION INTERNAL FIXATION (ORIF) PERIPROSTHETIC FRACTURE (Left) Patient reports pain as 2 on 0-10 scale.  Waiting on disposition.Hbg 8.8  Objective: Vital signs in last 24 hours: Temp:  [98.6 F (37 C)-99.8 F (37.7 C)] 98.9 F (37.2 C) (03/12 0552) Pulse Rate:  [84-101] 87 (03/12 0552) Resp:  [16-18] 16 (03/11 1530) BP: (107-124)/(53-63) 117/63 mmHg (03/12 0552) SpO2:  [94 %-97 %] 97 % (03/12 0552)  Intake/Output from previous day: 03/11 0701 - 03/12 0700 In: 120 [P.O.:120] Out: 300 [Urine:300] Intake/Output this shift:     Recent Labs  05/25/14 0800 05/26/14 0604  HGB 8.3* 8.8*    Recent Labs  05/25/14 0800 05/26/14 0604  WBC 11.2* 11.3*  RBC 2.78* 2.90*  HCT 25.8* 26.6*  PLT 141* 172    Recent Labs  05/25/14 0800 05/26/14 0604  NA 137 138  K 3.2* 3.2*  CL 107 106  CO2 24 23  BUN 8 7  CREATININE 0.66 0.72  GLUCOSE 129* 100*  CALCIUM 7.8* 8.3*   No results for input(s): LABPT, INR in the last 72 hours.  Neurologically intact  Assessment/Plan: 4 Days Post-Op Procedure(s) (LRB): OPEN REDUCTION INTERNAL FIXATION (ORIF) PERIPROSTHETIC FRACTURE (Left) Up with therapy  Icholas Irby A 05/27/2014, 8:03 AM

## 2014-05-27 NOTE — Clinical Social Work Note (Signed)
CSW made aware patient ready for d/c to [facility]. CSW contacted facility and confirmed bed availability. CSW met with patient who was pleasant and agreeable to dc. CSW spoke with patient's son and daughter in law who are agreeable to d/c and will come to hospital to dress patient prior to transport. CSW prepared d/c packet and placed in patient's shadow chart. CSW provided RN Maryann with number for room and report. CSW to arrange EMS transportation via [facility]. No further needs. CSW signing off.    Patrick-Jefferson, LCSWA Weekend Clinical Social Worker [phone]  

## 2014-05-27 NOTE — Progress Notes (Signed)
Prepared for transfer to SNF including gown changed, saline lock removed, foley drained of clear yellow urine. Received Tylenol PO for pain/ comfort. D/C to [facility] via stretcher - two attendants via [facility]. Report called to Rollene Rotundaatherine Thompson, RN, Supervisor @ [phone].

## 2014-05-28 ENCOUNTER — Encounter (HOSPITAL_COMMUNITY)
Admission: RE | Admit: 2014-05-28 | Discharge: 2014-05-28 | Disposition: A | Discharge status: Home / Self Care | Payer: Medicare Other | Source: Skilled Nursing Facility | Attending: Internal Medicine | Admitting: Internal Medicine

## 2014-05-28 ENCOUNTER — Ambulatory Visit (HOSPITAL_COMMUNITY)
Admission: RE | Admit: 2014-05-28 | Discharge: 2014-05-28 | Disposition: A | Discharge status: Home / Self Care | Payer: No Typology Code available for payment source | Source: Ambulatory Visit | Attending: Internal Medicine | Admitting: Internal Medicine

## 2014-05-28 ENCOUNTER — Non-Acute Institutional Stay (SKILLED_NURSING_FACILITY): Payer: Medicare Other | Admitting: Internal Medicine

## 2014-05-28 ENCOUNTER — Encounter (HOSPITAL_COMMUNITY): Payer: Self-pay | Admitting: Orthopedic Surgery

## 2014-05-28 ENCOUNTER — Other Ambulatory Visit: Payer: Self-pay | Admitting: Internal Medicine

## 2014-05-28 DIAGNOSIS — J189 Pneumonia, unspecified organism: Secondary | ICD-10-CM | POA: Diagnosis not present

## 2014-05-28 DIAGNOSIS — R059 Cough, unspecified: Secondary | ICD-10-CM

## 2014-05-28 DIAGNOSIS — R05 Cough: Secondary | ICD-10-CM | POA: Diagnosis not present

## 2014-05-28 DIAGNOSIS — R011 Cardiac murmur, unspecified: Secondary | ICD-10-CM

## 2014-05-28 DIAGNOSIS — R339 Retention of urine, unspecified: Secondary | ICD-10-CM

## 2014-05-28 DIAGNOSIS — J90 Pleural effusion, not elsewhere classified: Secondary | ICD-10-CM | POA: Diagnosis not present

## 2014-05-28 DIAGNOSIS — Z9889 Other specified postprocedural states: Secondary | ICD-10-CM | POA: Insufficient documentation

## 2014-05-28 LAB — CBC
HCT: 27.2 % — ABNORMAL LOW (ref 36.0–46.0)
HEMOGLOBIN: 9 g/dL — AB (ref 12.0–15.0)
MCH: 30.8 pg (ref 26.0–34.0)
MCHC: 33.1 g/dL (ref 30.0–36.0)
MCV: 93.2 fL (ref 78.0–100.0)
Platelets: 237 10*3/uL (ref 150–400)
RBC: 2.92 MIL/uL — ABNORMAL LOW (ref 3.87–5.11)
RDW: 14.5 % (ref 11.5–15.5)
WBC: 8.2 10*3/uL (ref 4.0–10.5)

## 2014-05-28 LAB — BASIC METABOLIC PANEL
Anion gap: 6 (ref 5–15)
BUN: 9 mg/dL (ref 6–23)
CO2: 27 mmol/L (ref 19–32)
Calcium: 8.8 mg/dL (ref 8.4–10.5)
Chloride: 104 mmol/L (ref 96–112)
Creatinine, Ser: 0.6 mg/dL (ref 0.50–1.10)
GFR calc Af Amer: >90 mL/min (ref >90)
GFR, EST NON AFRICAN AMERICAN: 80 mL/min — AB (ref >90)
GLUCOSE: 93 mg/dL (ref 70–99)
Potassium: 3.7 mmol/L (ref 3.5–5.1)
Sodium: 137 mmol/L (ref 135–145)

## 2014-05-29 ENCOUNTER — Other Ambulatory Visit: Payer: Self-pay | Admitting: *Deleted

## 2014-05-29 ENCOUNTER — Non-Acute Institutional Stay (SKILLED_NURSING_FACILITY): Payer: Medicare Other | Admitting: Internal Medicine

## 2014-05-29 DIAGNOSIS — R339 Retention of urine, unspecified: Secondary | ICD-10-CM

## 2014-05-29 DIAGNOSIS — R5082 Postprocedural fever: Secondary | ICD-10-CM

## 2014-05-29 LAB — URINE CULTURE
COLONY COUNT: NO GROWTH
Culture: NO GROWTH

## 2014-05-29 MED ORDER — TRAMADOL HCL 50 MG PO TABS: 25.0000 mg | ORAL_TABLET | Freq: Four times a day (QID) | ORAL | Status: DC | PRN

## 2014-05-29 NOTE — Progress Notes (Signed)
Patient ID: Kathryn HughsRuth J Bermingham, female   DOB: April 08, 1925, [age] y.o.   MRN: [id number] Facility; [facility] Chief complaint; follow-up of fever up to 101 History; this is a patient I admitted to the building yesterday. She is here for rehabilitation after suffering a left periprosthetic distal femur fracture. She has an extensive surgical wound on the lateral aspect of the left leg which is covered with adherent surgical dressing which I don't have in the building to replace nevertheless it is something that makes it very difficult to review wounds. In any case yesterday she spiked a fever up to 101. The her exam was most relevant for by basilar crackles right greater than left and the fact that she has a Foley catheter in place apparently for urinary retention however I wasn't able to find any information in the chart about this at all. I'm not even completely certain whether the Foley was placed in the hospital her since she has been here. He ordered a chest x-ray which I have reviewed. This showed a elevated right hemidiaphragm with platelike atelectasis in the right lower lobe. She had a fairly impressive gastric air bubble on the left with some atelectasis here as well but certainly nothing that looks like pneumonitis on a single AP chest. I have sent a urine culture. Nevertheless today she is stating she feels well. She has not run a fever overnight. I did not start her on empiric antibiotics.   Review of systems; Gen; no distress Respiratory; no shortness of breath, no cough, Cardiac; no exertional chest pain, no palpitations GI; no abnormal pain, no change in bowel habits GU; no dysuria, Foley catheter still in place Results for Kathryn HughsKELLUM, Dannie J (MRN [id number]) as of 05/29/2014 08:12  Ref. Range 05/28/2014 07:30 05/28/2014 10:04 05/28/2014 13:45 05/28/2014 16:30  Sodium Latest Range: 135-145 mmol/L 137     Potassium Latest Range: 3.5-5.1 mmol/L 3.7     Chloride Latest Range: 96-112 mmol/L 104     CO2 Latest  Range: 19-32 mmol/L 27     BUN Latest Range: 6-23 mg/dL 9     Creatinine Latest Range: 0.50-1.10 mg/dL 7.820.60     Calcium Latest Range: 8.4-10.5 mg/dL 8.8     GFR calc non Af Amer Latest Range: >90 mL/min 80 (L)     GFR calc Af Amer Latest Range: >90 mL/min >90     Glucose Latest Range: 70-99 mg/dL 93     Anion gap Latest Range: 5-15  6     WBC Latest Range: 4.0-10.5 K/uL   8.2   RBC Latest Range: 3.87-5.11 MIL/uL   2.92 (L)   Hemoglobin Latest Range: 12.0-15.0 g/dL   9.0 (L)   HCT Latest Range: 36.0-46.0 %   27.2 (L)   MCV Latest Range: 78.0-100.0 fL   93.2   MCH Latest Range: 26.0-34.0 pg   30.8   MCHC Latest Range: 30.0-36.0 g/dL   95.633.1   RDW Latest Range: 11.5-15.5 %   14.5   Platelets Latest Range: 150-400 K/uL   237   URINE CULTURE No range found    Rpt  LAB REPORT - SCANNED No range found  Attch     Physical examination Gen. patient is in no distress. She is afebrile this morning and had been so overnight Vitals; O2 sat is 93% on room air, respirations 18 and unlabored Pulse rate 72 Respiratory; still impressive bilateral rales on examination right greater than left. There is no wheezing. Her work of breathing appears to  be normal there is no accessory muscle use Cardiac; 3/6 systolic ejection murmur with splitting of the second sound. There is no signs of heart failure she appears to be euvolemic Abdomen; bowel sounds are positive liver no spleen no tenderness GU Foley catheter in place no suprapubic or costovertebral angle tenderness Extremities; impressive incision on the left lateral leg. This is covered with a surgical dressing which will make it difficult to view this. There is swelling of the left thigh which is predictable. I see nothing in the lower leg that would suggest a DVT. History day she had some warmth and tenderness and there is still some tenderness but nothing that seems to be evolving here  Impression/plan #1 fever; right now this seems to have abated  overnight. I think this could be an aspiration event or postoperative atelectasis and a very frail elderly woman. Of course in a catheterized patient a UTI is always a possibility. She is no longer febrile I am going to continue to observe her without antibiotics. She has not had any diarrhea. #2 cardiac murmur; this appears to be a systolic ejection murmur. There is apparently an echocardiogram pending. #3 new-onset diabetes she is on metformin. I'll need to follow this.

## 2014-05-29 NOTE — Telephone Encounter (Signed)
[facility] 

## 2014-05-29 NOTE — Progress Notes (Addendum)
Patient ID: [name], female   DOB: [birth year], 81 y.o.   MRN: [id number]               HISTORY & PHYSICAL  DATE:  05/28/2014           FACILITY: [facility]        LEVEL OF CARE:   SNF   CHIEF COMPLAINT:  Admission to SNF, post stay at [facility], 05/26/2014 through 05/27/2014.     HISTORY OF PRESENT ILLNESS:  This is a patient who was apparently walking in her home.  She felt something pop and then she fell.  She was discovered to have a periprosthetic fracture of the femur.  She had no loss of consciousness.  I am not sure about her fall history.  The prior fracture of the hip was about 17 years ago.  The fracture was on the left and discovered by CT scan to be a mildly impacted distal femur fracture.  She underwent ORIF.    Postoperatively, she required 1 U of packed cells.    She comes to us with a Foley catheter, apparently for urinary retention although I do not really see any results here.   She is on Lovenox for 30 days for DVT prophylaxis.    She was also discovered to be pre-diabetic based on a hemoglobin A1c of 6.2.  She was started on metformin.    She has been discovered to have a low-grade fever today with a temperature over 100.    PAST MEDICAL HISTORY/PROBLEM LIST:                 Undiagnosed cardiac murmurs for which she apparently had an echo during this admission, although I still do not see these results.    Periprosthetic fracture of the left femur distally.    Knee arthritis bilaterally.    Failure to thrive in adult.    Degenerative disc disease.     CURRENT MEDICATIONS:  Discharge medications include:        Os-Cal with D, 1 tablet b.i.d.      Lovenox 40 q.d.      Metformin 500 twice daily.    Senokot-S b.i.d.      Ultram 25 mg every 6 hours p.r.n. pain.    Turmeric 1 capsule daily.      REVIEW OF SYSTEMS:    CHEST/RESPIRATORY:  The patient does not describe shortness of breath or cough.   CARDIAC:   No chest pain.   GI:  No  diarrhea.   GU:  Foley catheter in place, although I really do not see the exact reason for this.  She was suspected to have a UTI in the hospital, although cultures were negative.  She received ceftriaxone in the hospital.  PHYSICAL EXAMINATION:     VITAL SIGNS:   TEMPERATURE:  Over 100.   O2 SATURATIONS:  Pulse ox at 95%.   RESPIRATIONS:  22.    PULSE:  86.     BLOOD PRESSURE:  137/74.   GENERAL APPEARANCE:  The patient is not in any distress.  She seems reasonably immobile, having trouble turning over.   CHEST/RESPIRATORY:  There are bibasilar crackles, right greater than left.   CARDIOVASCULAR:  CARDIAC:  3/6 systolic ejection murmur.  There is a split second sound.  The murmur does not radiate to the axilla or carotids that I can appreciate.   GASTROINTESTINAL:  ABDOMEN:   Nontender.  No masses.  LIVER/SPLEEN/KIDNEYS:  No liver, no spleen.   GENITOURINARY:  BLADDER:   No suprapubic or costovertebral angle tenderness.   Foley catheter is in place.   MUSCULOSKELETAL:   EXTREMITIES:   LEFT LOWER EXTREMITY:  Extensive surgical incision along the lateral aspect of the left leg.  There is predictable swelling here.  There is some tenderness in the left calf with some warmth.  This will need to be monitored.   CIRCULATION:   EDEMA/VARICOSITIES:  She has mild venous stasis.    LABORATORY DATA:  Lab work from today shows:    White count 8.2, hemoglobin 9 which is stable from her hospitalization, platelet count 237.    Basic metabolic panel:  Sodium 137, potassium 3.7, BUN 9, creatinine 0.6.     ASSESSMENT/PLAN:                    Fever.  I wonder about postoperative pneumonia.   She will need a chest x-ray.               Urinary retention.  A urine will need to be sent.    She has a Foley catheter in place.  I do not see the exact reason for the Foley catheter, although somebody said it was urinary retention.  I just do not see the description of this here.    Cardiac murmur.  I do  not really know what this is off the top.  It is not in the right location for aortic stenosis, although it sounds like a systolic ejection murmur.  There are no signs of heart failure.  She had an echo done on 05/24/2014.       Extensive surgery on the fractured femur. Covered with a surgical dressing. No obvious DVT

## 2014-06-05 ENCOUNTER — Encounter (HOSPITAL_COMMUNITY): Payer: Self-pay | Admitting: Orthopedic Surgery

## 2014-06-07 DIAGNOSIS — T84043D Periprosthetic fracture around internal prosthetic left knee joint, subsequent encounter: Secondary | ICD-10-CM | POA: Diagnosis not present

## 2014-06-16 ENCOUNTER — Encounter (HOSPITAL_COMMUNITY): Payer: Self-pay | Admitting: Orthopedic Surgery

## 2014-06-28 ENCOUNTER — Non-Acute Institutional Stay (SKILLED_NURSING_FACILITY): Payer: Medicare Other | Admitting: Internal Medicine

## 2014-06-28 ENCOUNTER — Encounter (HOSPITAL_COMMUNITY)
Admission: RE | Admit: 2014-06-28 | Discharge: 2014-06-28 | Disposition: A | Discharge status: Home / Self Care | Payer: Medicare Other | Source: Ambulatory Visit | Attending: Internal Medicine | Admitting: Internal Medicine

## 2014-06-28 DIAGNOSIS — E119 Type 2 diabetes mellitus without complications: Secondary | ICD-10-CM | POA: Diagnosis not present

## 2014-06-28 DIAGNOSIS — T84048D Periprosthetic fracture around other internal prosthetic joint, subsequent encounter: Secondary | ICD-10-CM

## 2014-06-28 DIAGNOSIS — M5030 Other cervical disc degeneration, unspecified cervical region: Secondary | ICD-10-CM

## 2014-06-28 DIAGNOSIS — Z96649 Presence of unspecified artificial hip joint: Principal | ICD-10-CM

## 2014-06-28 DIAGNOSIS — M978XXD Periprosthetic fracture around other internal prosthetic joint, subsequent encounter: Secondary | ICD-10-CM

## 2014-06-28 LAB — CBC WITH DIFFERENTIAL/PLATELET
BASOS ABS: 0 10*3/uL (ref 0.0–0.1)
BASOS PCT: 0 % (ref 0–1)
EOS ABS: 0.3 10*3/uL (ref 0.0–0.7)
EOS PCT: 3 % (ref 0–5)
HEMATOCRIT: 33.5 % — AB (ref 36.0–46.0)
Hemoglobin: 10.8 g/dL — ABNORMAL LOW (ref 12.0–15.0)
LYMPHS PCT: 25 % (ref 12–46)
Lymphs Abs: 1.9 10*3/uL (ref 0.7–4.0)
MCH: 30.9 pg (ref 26.0–34.0)
MCHC: 32.2 g/dL (ref 30.0–36.0)
MCV: 95.7 fL (ref 78.0–100.0)
MONO ABS: 0.8 10*3/uL (ref 0.1–1.0)
Monocytes Relative: 11 % (ref 3–12)
Neutro Abs: 4.7 10*3/uL (ref 1.7–7.7)
Neutrophils Relative %: 61 % (ref 43–77)
Platelets: 237 10*3/uL (ref 150–400)
RBC: 3.5 MIL/uL — ABNORMAL LOW (ref 3.87–5.11)
RDW: 15.1 % (ref 11.5–15.5)
WBC: 7.7 10*3/uL (ref 4.0–10.5)

## 2014-06-28 LAB — BASIC METABOLIC PANEL
Anion gap: 9 (ref 5–15)
BUN: 21 mg/dL (ref 6–23)
CO2: 27 mmol/L (ref 19–32)
CREATININE: 0.74 mg/dL (ref 0.50–1.10)
Calcium: 9.3 mg/dL (ref 8.4–10.5)
Chloride: 101 mmol/L (ref 96–112)
GFR, EST AFRICAN AMERICAN: 87 mL/min — AB (ref >90)
GFR, EST NON AFRICAN AMERICAN: 75 mL/min — AB (ref >90)
Glucose, Bld: 105 mg/dL — ABNORMAL HIGH (ref 70–99)
Potassium: 4.3 mmol/L (ref 3.5–5.1)
Sodium: 137 mmol/L (ref 135–145)

## 2014-06-28 NOTE — Progress Notes (Signed)
Patient ID: [name], female   DOB: 13-Jan-1926, 81 y.o.   MRN: [id number]  :       This is a routine visit               FACILITY: [facility]        LEVEL OF CARE:   SNF   CHIEF COMPLAINT:  Medical management of left femur fracture diabetes   degenerative disc disease  HISTORY OF PRESENT ILLNESS:  This is a patient who was apparently walking in her home.  She felt something pop and then she fell.  She was discovered to have a periprosthetic fracture of the femur.  She had no loss of consciousness.  .  The prior fracture of the hip was about 17 years ago.  The fracture was on the left and discovered by CT scan to be a mildly impacted distal femur fracture.  She underwent ORIF.    Postoperatively, she required 1 U of packed cells.    Her postop status been quite unremarkable she has progressed with physical therapy she recently saw orthopedics and was thought to be doing well.    She was also discovered to be pre-diabetic during her recent hospitalization based on a hemoglobin A1c of 6.2.  She was started on metformin. --However this has been discontinued her blood sugars remain in the 80s to low 100s on no medication   A she has no acute complaints she is receiving from it all as needed for pain she does have some history of degenerative disc  disease      PAST MEDICAL HISTORY/PROBLEM LIST:                 Undiagnosed cardiac murmurs for which she apparently had an echo during this admission, although I still do not see these results.    Periprosthetic fracture of the left femur distally.    Knee arthritis bilaterally.    Failure to thrive in adult.    Degenerative disc disease.     CURRENT MEDICATIONS:  Discharge medications include:        Os-Cal with D, 1 tablet b.i.d.      Potassium 20 mEq daily    .    Senokot-S b.i.d.      Ultram 25 mg every 6 hours p.r.n. pain.    Turmeric 1 capsule daily.      REVIEW OF SYSTEMS: In general no complaints of  fever chills.  Skin does not complain of rashes or itching surgical site left femur appears to be healing unremarkably.  Head ears eyes nose mouth and throat does not complain of any visual changes or sore throat    CHEST/RESPIRATORY:  The patient does not describe shortness of breath or cough.   CARDIAC:   No chest pain.   GI:  No diarrhea.  Domino discomfort GU:   Does not complain of any dysuria.  Musculoskeletal appears to be doing well status post femur fracture does not complain of pain currently.  Neurologic is not complaining of dizziness numbness or headache.  .  PHYSICAL EXAMINATION:     VITAL SIGNS:   Temperature 97.5 pulse 98 respirations 18 blood pressure 102/56 weight is 128.4 .      GENERAL APPEARANCE:  The patient is not in any distress. N comfortably in her wheelchair.  Her skin is warm and dry there is a well-healed surgical scar left femur area there's crusting there is no significant sign of infection  CHEST/RESPIRATORY:  Or to auscultation no labored breathing CARDIOVASCULAR:  CARDIAC:  3/6 systolic ejection murmur. .  The murmur does not radiate to the axilla or carotids that I can appreciate.  --Has mild lower extremity edema a bit more on the left versus the right pedal pulses are intact GASTROINTESTINAL:  ABDOMEN:   Nontender.  Soft with positive bowel sounds   LI      MUSCULOSKELETAL:   EXTREMITIES:   LEFT LOWER EXTREMITY:  Extensive surgical incision along the lateral aspect of the left leg--appears unremarkable-she moves all extremities 4 with some lower extremity weakness especially on the left however is doing well per recent orthopedic evaluation.   CIRCULATION:   EDEMA/VARICOSITIES:  She has mild venous stasis noted above.  Neurologic is grossly intact no lateralizing findings her speech is clear.  Psych she appears grossly alert and oriented pleasant and appropriate.    LABORATORY DATA:    05/27/2013.  Sodium 137 potassium 3.7 BUN 9  creatinine 0.6.  WBC 8.2 hemoglobin 9.0 platelets 237.  05/26/2014.  Sodium 138 potassium 3.2 BUN 7 creatinine 0.72.  05/24/2014.  WBC 11.3 hemoglobin 8.8 platelets 172-liver function tests within normal limits except albumin of 2.8           ASSESSMENT/PLAN:                    Fractured left femur-she appears to be rehabbing well as noted above she does have tramadol as needed for pain she had been on Lovenox for anticoagulation this was discontinued earlier this week per orthopedic recommendation--she is on calcium  .    Cardiac murmur.  .  There are no signs of heart failure.  She had an echo done on 05/24/2014. --I do not see the results however nonetheless clinically appears stable.    #3 history of diabetes type 2 again blood sugars appear to be stable currently on no medication.    Of note Will check a CBC and BMP for updated values appears she does have at some point some history of hypokalemia she is on low-dose potassium.  [id number]

## 2014-07-02 ENCOUNTER — Encounter: Payer: Self-pay | Admitting: Internal Medicine

## 2014-07-02 DIAGNOSIS — E119 Type 2 diabetes mellitus without complications: Secondary | ICD-10-CM | POA: Insufficient documentation

## 2014-07-05 DIAGNOSIS — T84043D Periprosthetic fracture around internal prosthetic left knee joint, subsequent encounter: Secondary | ICD-10-CM | POA: Diagnosis not present

## 2014-08-25 DIAGNOSIS — T84043D Periprosthetic fracture around internal prosthetic left knee joint, subsequent encounter: Secondary | ICD-10-CM | POA: Diagnosis not present

## 2014-09-04 ENCOUNTER — Non-Acute Institutional Stay (SKILLED_NURSING_FACILITY): Payer: Medicare Other | Admitting: Internal Medicine

## 2014-09-04 DIAGNOSIS — R339 Retention of urine, unspecified: Secondary | ICD-10-CM

## 2014-09-04 DIAGNOSIS — R011 Cardiac murmur, unspecified: Secondary | ICD-10-CM

## 2014-09-04 DIAGNOSIS — T84048D Periprosthetic fracture around other internal prosthetic joint, subsequent encounter: Secondary | ICD-10-CM

## 2014-09-04 DIAGNOSIS — Z96649 Presence of unspecified artificial hip joint: Principal | ICD-10-CM

## 2014-09-04 DIAGNOSIS — M978XXD Periprosthetic fracture around other internal prosthetic joint, subsequent encounter: Secondary | ICD-10-CM

## 2014-09-05 DIAGNOSIS — L89612 Pressure ulcer of right heel, stage 2: Secondary | ICD-10-CM | POA: Diagnosis not present

## 2014-09-05 DIAGNOSIS — L89622 Pressure ulcer of left heel, stage 2: Secondary | ICD-10-CM | POA: Diagnosis not present

## 2014-09-05 DIAGNOSIS — S72402D Unspecified fracture of lower end of left femur, subsequent encounter for closed fracture with routine healing: Secondary | ICD-10-CM | POA: Diagnosis not present

## 2014-09-05 DIAGNOSIS — R627 Adult failure to thrive: Secondary | ICD-10-CM | POA: Diagnosis not present

## 2014-09-05 DIAGNOSIS — M1711 Unilateral primary osteoarthritis, right knee: Secondary | ICD-10-CM | POA: Diagnosis not present

## 2014-09-05 DIAGNOSIS — M1712 Unilateral primary osteoarthritis, left knee: Secondary | ICD-10-CM | POA: Diagnosis not present

## 2014-09-08 DIAGNOSIS — L89612 Pressure ulcer of right heel, stage 2: Secondary | ICD-10-CM | POA: Diagnosis not present

## 2014-09-08 DIAGNOSIS — M1711 Unilateral primary osteoarthritis, right knee: Secondary | ICD-10-CM | POA: Diagnosis not present

## 2014-09-08 DIAGNOSIS — S72402D Unspecified fracture of lower end of left femur, subsequent encounter for closed fracture with routine healing: Secondary | ICD-10-CM | POA: Diagnosis not present

## 2014-09-08 DIAGNOSIS — L89622 Pressure ulcer of left heel, stage 2: Secondary | ICD-10-CM | POA: Diagnosis not present

## 2014-09-08 DIAGNOSIS — R627 Adult failure to thrive: Secondary | ICD-10-CM | POA: Diagnosis not present

## 2014-09-08 DIAGNOSIS — M1712 Unilateral primary osteoarthritis, left knee: Secondary | ICD-10-CM | POA: Diagnosis not present

## 2014-09-09 NOTE — Progress Notes (Addendum)
Patient ID: [name], female   DOB: 09/23/1925, 81 y.o.   MRN: [id number]                PROGRESS NOTE  DATE:  09/04/2014              FACILITY: [facility]        LEVEL OF CARE:   SNF   Acute Visit/Discharge Visit   CHIEF COMPLAINT:  Pre-discharge review.      HISTORY OF PRESENT ILLNESS:  This is a somewhat frail, 81 year-old woman who came here after suffering a fall and had a left periprosthetic distal femur fracture.  She had surgery and developed a perioperative pneumonia and some degree of urinary retention, although the both of these appear to have resolved.    She has done fairly well in physical therapy and is now ready for discharge home to the care of her family.    Of note is that the discharge summary from the hospital said she had an echocardiogram to evaluate a systolic murmur.  When I did her history and physical in late April, I made reference to this but did not really follow up on it.  In fact, I do not think this was actually done.  Nevertheless, the patient has remained asymptomatic.         PHYSICAL EXAMINATION:   VITAL SIGNS:     PULSE:  82.       RESPIRATIONS:  18 and unlabored.   02 SATURATIONS:  98% on room air.    GENERAL APPEARANCE:  The patient is not in any distress.   CHEST/RESPIRATORY:  Clear air entry bilaterally.    CARDIOVASCULAR:   CARDIAC:  Heart sounds:  S1 is normal.  S2 is accentuated.  There is a 3/6 systolic murmur at the lower left sternal border.  This sounds like a systolic ejection murmur.  There are no signs of heart failure.  No gallops.   EDEMA/VARICOSITIES:  Extremities:  No edema.    ASSESSMENT/PLAN:                        Left periprosthetic fracture.  She will go home with PT, OT, skilled nursing, a wheelchair, and a shower bench.  I believe she is mobile for short distances.    Urinary retention.  This appears to have resolved.    Systolic ejection murmer: I will leave this to primary care to follow-up. She  will need this reordered.   She will get Tramadol 25 mg q.6 hours p.r.n.

## 2014-09-11 DIAGNOSIS — S72402D Unspecified fracture of lower end of left femur, subsequent encounter for closed fracture with routine healing: Secondary | ICD-10-CM | POA: Diagnosis not present

## 2014-09-11 DIAGNOSIS — R627 Adult failure to thrive: Secondary | ICD-10-CM | POA: Diagnosis not present

## 2014-09-11 DIAGNOSIS — M1712 Unilateral primary osteoarthritis, left knee: Secondary | ICD-10-CM | POA: Diagnosis not present

## 2014-09-11 DIAGNOSIS — L89612 Pressure ulcer of right heel, stage 2: Secondary | ICD-10-CM | POA: Diagnosis not present

## 2014-09-11 DIAGNOSIS — M1711 Unilateral primary osteoarthritis, right knee: Secondary | ICD-10-CM | POA: Diagnosis not present

## 2014-09-11 DIAGNOSIS — L89622 Pressure ulcer of left heel, stage 2: Secondary | ICD-10-CM | POA: Diagnosis not present

## 2014-09-12 DIAGNOSIS — M1712 Unilateral primary osteoarthritis, left knee: Secondary | ICD-10-CM | POA: Diagnosis not present

## 2014-09-12 DIAGNOSIS — S72402D Unspecified fracture of lower end of left femur, subsequent encounter for closed fracture with routine healing: Secondary | ICD-10-CM | POA: Diagnosis not present

## 2014-09-12 DIAGNOSIS — M1711 Unilateral primary osteoarthritis, right knee: Secondary | ICD-10-CM | POA: Diagnosis not present

## 2014-09-12 DIAGNOSIS — R627 Adult failure to thrive: Secondary | ICD-10-CM | POA: Diagnosis not present

## 2014-09-12 DIAGNOSIS — L89612 Pressure ulcer of right heel, stage 2: Secondary | ICD-10-CM | POA: Diagnosis not present

## 2014-09-12 DIAGNOSIS — L89622 Pressure ulcer of left heel, stage 2: Secondary | ICD-10-CM | POA: Diagnosis not present

## 2014-09-13 DIAGNOSIS — S72402D Unspecified fracture of lower end of left femur, subsequent encounter for closed fracture with routine healing: Secondary | ICD-10-CM | POA: Diagnosis not present

## 2014-09-13 DIAGNOSIS — R627 Adult failure to thrive: Secondary | ICD-10-CM | POA: Diagnosis not present

## 2014-09-13 DIAGNOSIS — M1711 Unilateral primary osteoarthritis, right knee: Secondary | ICD-10-CM | POA: Diagnosis not present

## 2014-09-13 DIAGNOSIS — M1712 Unilateral primary osteoarthritis, left knee: Secondary | ICD-10-CM | POA: Diagnosis not present

## 2014-09-13 DIAGNOSIS — L89612 Pressure ulcer of right heel, stage 2: Secondary | ICD-10-CM | POA: Diagnosis not present

## 2014-09-13 DIAGNOSIS — L89622 Pressure ulcer of left heel, stage 2: Secondary | ICD-10-CM | POA: Diagnosis not present

## 2014-09-14 DIAGNOSIS — M1711 Unilateral primary osteoarthritis, right knee: Secondary | ICD-10-CM | POA: Diagnosis not present

## 2014-09-14 DIAGNOSIS — M1712 Unilateral primary osteoarthritis, left knee: Secondary | ICD-10-CM | POA: Diagnosis not present

## 2014-09-14 DIAGNOSIS — L89612 Pressure ulcer of right heel, stage 2: Secondary | ICD-10-CM | POA: Diagnosis not present

## 2014-09-14 DIAGNOSIS — S72402D Unspecified fracture of lower end of left femur, subsequent encounter for closed fracture with routine healing: Secondary | ICD-10-CM | POA: Diagnosis not present

## 2014-09-14 DIAGNOSIS — R627 Adult failure to thrive: Secondary | ICD-10-CM | POA: Diagnosis not present

## 2014-09-14 DIAGNOSIS — L89622 Pressure ulcer of left heel, stage 2: Secondary | ICD-10-CM | POA: Diagnosis not present

## 2014-09-18 DIAGNOSIS — R627 Adult failure to thrive: Secondary | ICD-10-CM | POA: Diagnosis not present

## 2014-09-18 DIAGNOSIS — L89612 Pressure ulcer of right heel, stage 2: Secondary | ICD-10-CM | POA: Diagnosis not present

## 2014-09-18 DIAGNOSIS — S72402D Unspecified fracture of lower end of left femur, subsequent encounter for closed fracture with routine healing: Secondary | ICD-10-CM | POA: Diagnosis not present

## 2014-09-18 DIAGNOSIS — M1712 Unilateral primary osteoarthritis, left knee: Secondary | ICD-10-CM | POA: Diagnosis not present

## 2014-09-18 DIAGNOSIS — M1711 Unilateral primary osteoarthritis, right knee: Secondary | ICD-10-CM | POA: Diagnosis not present

## 2014-09-18 DIAGNOSIS — L89622 Pressure ulcer of left heel, stage 2: Secondary | ICD-10-CM | POA: Diagnosis not present

## 2014-09-21 DIAGNOSIS — M1712 Unilateral primary osteoarthritis, left knee: Secondary | ICD-10-CM | POA: Diagnosis not present

## 2014-09-21 DIAGNOSIS — L89612 Pressure ulcer of right heel, stage 2: Secondary | ICD-10-CM | POA: Diagnosis not present

## 2014-09-21 DIAGNOSIS — S72402D Unspecified fracture of lower end of left femur, subsequent encounter for closed fracture with routine healing: Secondary | ICD-10-CM | POA: Diagnosis not present

## 2014-09-21 DIAGNOSIS — R627 Adult failure to thrive: Secondary | ICD-10-CM | POA: Diagnosis not present

## 2014-09-21 DIAGNOSIS — M1711 Unilateral primary osteoarthritis, right knee: Secondary | ICD-10-CM | POA: Diagnosis not present

## 2014-09-21 DIAGNOSIS — L89622 Pressure ulcer of left heel, stage 2: Secondary | ICD-10-CM | POA: Diagnosis not present

## 2014-09-22 DIAGNOSIS — S72402D Unspecified fracture of lower end of left femur, subsequent encounter for closed fracture with routine healing: Secondary | ICD-10-CM | POA: Diagnosis not present

## 2014-09-22 DIAGNOSIS — M1711 Unilateral primary osteoarthritis, right knee: Secondary | ICD-10-CM | POA: Diagnosis not present

## 2014-09-22 DIAGNOSIS — R627 Adult failure to thrive: Secondary | ICD-10-CM | POA: Diagnosis not present

## 2014-09-22 DIAGNOSIS — M1712 Unilateral primary osteoarthritis, left knee: Secondary | ICD-10-CM | POA: Diagnosis not present

## 2014-09-22 DIAGNOSIS — L89622 Pressure ulcer of left heel, stage 2: Secondary | ICD-10-CM | POA: Diagnosis not present

## 2014-09-22 DIAGNOSIS — L89612 Pressure ulcer of right heel, stage 2: Secondary | ICD-10-CM | POA: Diagnosis not present

## 2014-09-25 DIAGNOSIS — L89612 Pressure ulcer of right heel, stage 2: Secondary | ICD-10-CM | POA: Diagnosis not present

## 2014-09-25 DIAGNOSIS — L89622 Pressure ulcer of left heel, stage 2: Secondary | ICD-10-CM | POA: Diagnosis not present

## 2014-09-25 DIAGNOSIS — M1712 Unilateral primary osteoarthritis, left knee: Secondary | ICD-10-CM | POA: Diagnosis not present

## 2014-09-25 DIAGNOSIS — M1711 Unilateral primary osteoarthritis, right knee: Secondary | ICD-10-CM | POA: Diagnosis not present

## 2014-09-25 DIAGNOSIS — R627 Adult failure to thrive: Secondary | ICD-10-CM | POA: Diagnosis not present

## 2014-09-25 DIAGNOSIS — S72402D Unspecified fracture of lower end of left femur, subsequent encounter for closed fracture with routine healing: Secondary | ICD-10-CM | POA: Diagnosis not present

## 2014-09-27 DIAGNOSIS — M1711 Unilateral primary osteoarthritis, right knee: Secondary | ICD-10-CM | POA: Diagnosis not present

## 2014-09-27 DIAGNOSIS — M1712 Unilateral primary osteoarthritis, left knee: Secondary | ICD-10-CM | POA: Diagnosis not present

## 2014-09-27 DIAGNOSIS — L89622 Pressure ulcer of left heel, stage 2: Secondary | ICD-10-CM | POA: Diagnosis not present

## 2014-09-27 DIAGNOSIS — L89612 Pressure ulcer of right heel, stage 2: Secondary | ICD-10-CM | POA: Diagnosis not present

## 2014-09-27 DIAGNOSIS — R627 Adult failure to thrive: Secondary | ICD-10-CM | POA: Diagnosis not present

## 2014-09-27 DIAGNOSIS — S72402D Unspecified fracture of lower end of left femur, subsequent encounter for closed fracture with routine healing: Secondary | ICD-10-CM | POA: Diagnosis not present

## 2014-09-29 DIAGNOSIS — L89622 Pressure ulcer of left heel, stage 2: Secondary | ICD-10-CM | POA: Diagnosis not present

## 2014-09-29 DIAGNOSIS — M1712 Unilateral primary osteoarthritis, left knee: Secondary | ICD-10-CM | POA: Diagnosis not present

## 2014-09-29 DIAGNOSIS — L89612 Pressure ulcer of right heel, stage 2: Secondary | ICD-10-CM | POA: Diagnosis not present

## 2014-09-29 DIAGNOSIS — M1711 Unilateral primary osteoarthritis, right knee: Secondary | ICD-10-CM | POA: Diagnosis not present

## 2014-09-29 DIAGNOSIS — R627 Adult failure to thrive: Secondary | ICD-10-CM | POA: Diagnosis not present

## 2014-09-29 DIAGNOSIS — S72402D Unspecified fracture of lower end of left femur, subsequent encounter for closed fracture with routine healing: Secondary | ICD-10-CM | POA: Diagnosis not present

## 2014-10-02 DIAGNOSIS — R627 Adult failure to thrive: Secondary | ICD-10-CM | POA: Diagnosis not present

## 2014-10-02 DIAGNOSIS — L89612 Pressure ulcer of right heel, stage 2: Secondary | ICD-10-CM | POA: Diagnosis not present

## 2014-10-02 DIAGNOSIS — M1711 Unilateral primary osteoarthritis, right knee: Secondary | ICD-10-CM | POA: Diagnosis not present

## 2014-10-02 DIAGNOSIS — M1712 Unilateral primary osteoarthritis, left knee: Secondary | ICD-10-CM | POA: Diagnosis not present

## 2014-10-02 DIAGNOSIS — L89622 Pressure ulcer of left heel, stage 2: Secondary | ICD-10-CM | POA: Diagnosis not present

## 2014-10-02 DIAGNOSIS — S72402D Unspecified fracture of lower end of left femur, subsequent encounter for closed fracture with routine healing: Secondary | ICD-10-CM | POA: Diagnosis not present

## 2014-10-03 DIAGNOSIS — R627 Adult failure to thrive: Secondary | ICD-10-CM | POA: Diagnosis not present

## 2014-10-03 DIAGNOSIS — L89612 Pressure ulcer of right heel, stage 2: Secondary | ICD-10-CM | POA: Diagnosis not present

## 2014-10-03 DIAGNOSIS — M1712 Unilateral primary osteoarthritis, left knee: Secondary | ICD-10-CM | POA: Diagnosis not present

## 2014-10-03 DIAGNOSIS — S72402D Unspecified fracture of lower end of left femur, subsequent encounter for closed fracture with routine healing: Secondary | ICD-10-CM | POA: Diagnosis not present

## 2014-10-03 DIAGNOSIS — L89622 Pressure ulcer of left heel, stage 2: Secondary | ICD-10-CM | POA: Diagnosis not present

## 2014-10-03 DIAGNOSIS — M1711 Unilateral primary osteoarthritis, right knee: Secondary | ICD-10-CM | POA: Diagnosis not present

## 2014-10-04 DIAGNOSIS — M1711 Unilateral primary osteoarthritis, right knee: Secondary | ICD-10-CM | POA: Diagnosis not present

## 2014-10-04 DIAGNOSIS — M1712 Unilateral primary osteoarthritis, left knee: Secondary | ICD-10-CM | POA: Diagnosis not present

## 2014-10-04 DIAGNOSIS — S72402D Unspecified fracture of lower end of left femur, subsequent encounter for closed fracture with routine healing: Secondary | ICD-10-CM | POA: Diagnosis not present

## 2014-10-04 DIAGNOSIS — L89622 Pressure ulcer of left heel, stage 2: Secondary | ICD-10-CM | POA: Diagnosis not present

## 2014-10-04 DIAGNOSIS — L89612 Pressure ulcer of right heel, stage 2: Secondary | ICD-10-CM | POA: Diagnosis not present

## 2014-10-04 DIAGNOSIS — R627 Adult failure to thrive: Secondary | ICD-10-CM | POA: Diagnosis not present

## 2014-10-05 DIAGNOSIS — L89612 Pressure ulcer of right heel, stage 2: Secondary | ICD-10-CM | POA: Diagnosis not present

## 2014-10-05 DIAGNOSIS — R627 Adult failure to thrive: Secondary | ICD-10-CM | POA: Diagnosis not present

## 2014-10-05 DIAGNOSIS — M1711 Unilateral primary osteoarthritis, right knee: Secondary | ICD-10-CM | POA: Diagnosis not present

## 2014-10-05 DIAGNOSIS — M1712 Unilateral primary osteoarthritis, left knee: Secondary | ICD-10-CM | POA: Diagnosis not present

## 2014-10-05 DIAGNOSIS — S72402D Unspecified fracture of lower end of left femur, subsequent encounter for closed fracture with routine healing: Secondary | ICD-10-CM | POA: Diagnosis not present

## 2014-10-05 DIAGNOSIS — L89622 Pressure ulcer of left heel, stage 2: Secondary | ICD-10-CM | POA: Diagnosis not present

## 2014-10-06 DIAGNOSIS — S72402D Unspecified fracture of lower end of left femur, subsequent encounter for closed fracture with routine healing: Secondary | ICD-10-CM | POA: Diagnosis not present

## 2014-10-06 DIAGNOSIS — M1712 Unilateral primary osteoarthritis, left knee: Secondary | ICD-10-CM | POA: Diagnosis not present

## 2014-10-06 DIAGNOSIS — M1711 Unilateral primary osteoarthritis, right knee: Secondary | ICD-10-CM | POA: Diagnosis not present

## 2014-10-06 DIAGNOSIS — L89612 Pressure ulcer of right heel, stage 2: Secondary | ICD-10-CM | POA: Diagnosis not present

## 2014-10-06 DIAGNOSIS — L89622 Pressure ulcer of left heel, stage 2: Secondary | ICD-10-CM | POA: Diagnosis not present

## 2014-10-06 DIAGNOSIS — R627 Adult failure to thrive: Secondary | ICD-10-CM | POA: Diagnosis not present

## 2014-10-09 DIAGNOSIS — L89622 Pressure ulcer of left heel, stage 2: Secondary | ICD-10-CM | POA: Diagnosis not present

## 2014-10-09 DIAGNOSIS — S72402D Unspecified fracture of lower end of left femur, subsequent encounter for closed fracture with routine healing: Secondary | ICD-10-CM | POA: Diagnosis not present

## 2014-10-09 DIAGNOSIS — M1711 Unilateral primary osteoarthritis, right knee: Secondary | ICD-10-CM | POA: Diagnosis not present

## 2014-10-09 DIAGNOSIS — M1712 Unilateral primary osteoarthritis, left knee: Secondary | ICD-10-CM | POA: Diagnosis not present

## 2014-10-09 DIAGNOSIS — R627 Adult failure to thrive: Secondary | ICD-10-CM | POA: Diagnosis not present

## 2014-10-09 DIAGNOSIS — L89612 Pressure ulcer of right heel, stage 2: Secondary | ICD-10-CM | POA: Diagnosis not present

## 2014-10-10 DIAGNOSIS — M1711 Unilateral primary osteoarthritis, right knee: Secondary | ICD-10-CM | POA: Diagnosis not present

## 2014-10-10 DIAGNOSIS — S72402D Unspecified fracture of lower end of left femur, subsequent encounter for closed fracture with routine healing: Secondary | ICD-10-CM | POA: Diagnosis not present

## 2014-10-10 DIAGNOSIS — M1712 Unilateral primary osteoarthritis, left knee: Secondary | ICD-10-CM | POA: Diagnosis not present

## 2014-10-10 DIAGNOSIS — R627 Adult failure to thrive: Secondary | ICD-10-CM | POA: Diagnosis not present

## 2014-10-10 DIAGNOSIS — L89612 Pressure ulcer of right heel, stage 2: Secondary | ICD-10-CM | POA: Diagnosis not present

## 2014-10-10 DIAGNOSIS — L89622 Pressure ulcer of left heel, stage 2: Secondary | ICD-10-CM | POA: Diagnosis not present

## 2014-10-11 DIAGNOSIS — L89622 Pressure ulcer of left heel, stage 2: Secondary | ICD-10-CM | POA: Diagnosis not present

## 2014-10-11 DIAGNOSIS — M1711 Unilateral primary osteoarthritis, right knee: Secondary | ICD-10-CM | POA: Diagnosis not present

## 2014-10-11 DIAGNOSIS — S72402D Unspecified fracture of lower end of left femur, subsequent encounter for closed fracture with routine healing: Secondary | ICD-10-CM | POA: Diagnosis not present

## 2014-10-11 DIAGNOSIS — R627 Adult failure to thrive: Secondary | ICD-10-CM | POA: Diagnosis not present

## 2014-10-11 DIAGNOSIS — M1712 Unilateral primary osteoarthritis, left knee: Secondary | ICD-10-CM | POA: Diagnosis not present

## 2014-10-11 DIAGNOSIS — L89612 Pressure ulcer of right heel, stage 2: Secondary | ICD-10-CM | POA: Diagnosis not present

## 2014-10-20 DIAGNOSIS — M1711 Unilateral primary osteoarthritis, right knee: Secondary | ICD-10-CM | POA: Diagnosis not present

## 2014-10-20 DIAGNOSIS — S72402D Unspecified fracture of lower end of left femur, subsequent encounter for closed fracture with routine healing: Secondary | ICD-10-CM | POA: Diagnosis not present

## 2014-10-20 DIAGNOSIS — L89612 Pressure ulcer of right heel, stage 2: Secondary | ICD-10-CM | POA: Diagnosis not present

## 2014-10-20 DIAGNOSIS — R627 Adult failure to thrive: Secondary | ICD-10-CM | POA: Diagnosis not present

## 2014-10-20 DIAGNOSIS — L89622 Pressure ulcer of left heel, stage 2: Secondary | ICD-10-CM | POA: Diagnosis not present

## 2014-10-20 DIAGNOSIS — M1712 Unilateral primary osteoarthritis, left knee: Secondary | ICD-10-CM | POA: Diagnosis not present

## 2014-10-25 DIAGNOSIS — M1711 Unilateral primary osteoarthritis, right knee: Secondary | ICD-10-CM | POA: Diagnosis not present

## 2014-10-25 DIAGNOSIS — S72402D Unspecified fracture of lower end of left femur, subsequent encounter for closed fracture with routine healing: Secondary | ICD-10-CM | POA: Diagnosis not present

## 2014-10-25 DIAGNOSIS — L89622 Pressure ulcer of left heel, stage 2: Secondary | ICD-10-CM | POA: Diagnosis not present

## 2014-10-25 DIAGNOSIS — M1712 Unilateral primary osteoarthritis, left knee: Secondary | ICD-10-CM | POA: Diagnosis not present

## 2014-10-25 DIAGNOSIS — R627 Adult failure to thrive: Secondary | ICD-10-CM | POA: Diagnosis not present

## 2014-10-25 DIAGNOSIS — L89612 Pressure ulcer of right heel, stage 2: Secondary | ICD-10-CM | POA: Diagnosis not present

## 2014-10-31 DIAGNOSIS — S72402D Unspecified fracture of lower end of left femur, subsequent encounter for closed fracture with routine healing: Secondary | ICD-10-CM | POA: Diagnosis not present

## 2014-10-31 DIAGNOSIS — L89622 Pressure ulcer of left heel, stage 2: Secondary | ICD-10-CM | POA: Diagnosis not present

## 2014-10-31 DIAGNOSIS — M1712 Unilateral primary osteoarthritis, left knee: Secondary | ICD-10-CM | POA: Diagnosis not present

## 2014-10-31 DIAGNOSIS — L89612 Pressure ulcer of right heel, stage 2: Secondary | ICD-10-CM | POA: Diagnosis not present

## 2014-10-31 DIAGNOSIS — R627 Adult failure to thrive: Secondary | ICD-10-CM | POA: Diagnosis not present

## 2014-10-31 DIAGNOSIS — M1711 Unilateral primary osteoarthritis, right knee: Secondary | ICD-10-CM | POA: Diagnosis not present

## 2015-09-21 DIAGNOSIS — S81801A Unspecified open wound, right lower leg, initial encounter: Secondary | ICD-10-CM | POA: Diagnosis not present

## 2015-09-21 DIAGNOSIS — I872 Venous insufficiency (chronic) (peripheral): Secondary | ICD-10-CM | POA: Diagnosis not present

## 2015-09-21 DIAGNOSIS — S81802A Unspecified open wound, left lower leg, initial encounter: Secondary | ICD-10-CM | POA: Diagnosis not present

## 2015-10-05 DIAGNOSIS — E782 Mixed hyperlipidemia: Secondary | ICD-10-CM | POA: Diagnosis not present

## 2015-10-10 DIAGNOSIS — Z0000 Encounter for general adult medical examination without abnormal findings: Secondary | ICD-10-CM | POA: Diagnosis not present

## 2015-10-10 DIAGNOSIS — L97909 Non-pressure chronic ulcer of unspecified part of unspecified lower leg with unspecified severity: Secondary | ICD-10-CM | POA: Diagnosis not present

## 2015-10-19 DIAGNOSIS — Z0000 Encounter for general adult medical examination without abnormal findings: Secondary | ICD-10-CM | POA: Diagnosis not present

## 2015-10-24 DIAGNOSIS — L97829 Non-pressure chronic ulcer of other part of left lower leg with unspecified severity: Secondary | ICD-10-CM | POA: Diagnosis not present

## 2015-10-24 DIAGNOSIS — L97819 Non-pressure chronic ulcer of other part of right lower leg with unspecified severity: Secondary | ICD-10-CM | POA: Diagnosis not present

## 2015-10-24 DIAGNOSIS — I83018 Varicose veins of right lower extremity with ulcer other part of lower leg: Secondary | ICD-10-CM | POA: Diagnosis not present

## 2015-10-24 DIAGNOSIS — Z880 Allergy status to penicillin: Secondary | ICD-10-CM | POA: Diagnosis not present

## 2015-10-24 DIAGNOSIS — I872 Venous insufficiency (chronic) (peripheral): Secondary | ICD-10-CM | POA: Diagnosis not present

## 2015-10-24 DIAGNOSIS — M810 Age-related osteoporosis without current pathological fracture: Secondary | ICD-10-CM | POA: Diagnosis not present

## 2015-10-24 DIAGNOSIS — Z885 Allergy status to narcotic agent status: Secondary | ICD-10-CM | POA: Diagnosis not present

## 2015-10-24 DIAGNOSIS — L97811 Non-pressure chronic ulcer of other part of right lower leg limited to breakdown of skin: Secondary | ICD-10-CM | POA: Diagnosis not present

## 2015-10-29 DIAGNOSIS — L97909 Non-pressure chronic ulcer of unspecified part of unspecified lower leg with unspecified severity: Secondary | ICD-10-CM | POA: Diagnosis not present

## 2015-10-29 DIAGNOSIS — I83009 Varicose veins of unspecified lower extremity with ulcer of unspecified site: Secondary | ICD-10-CM | POA: Diagnosis not present

## 2015-10-30 DIAGNOSIS — M810 Age-related osteoporosis without current pathological fracture: Secondary | ICD-10-CM | POA: Diagnosis not present

## 2015-10-30 DIAGNOSIS — L97829 Non-pressure chronic ulcer of other part of left lower leg with unspecified severity: Secondary | ICD-10-CM | POA: Diagnosis not present

## 2015-10-30 DIAGNOSIS — L97819 Non-pressure chronic ulcer of other part of right lower leg with unspecified severity: Secondary | ICD-10-CM | POA: Diagnosis not present

## 2015-10-30 DIAGNOSIS — I872 Venous insufficiency (chronic) (peripheral): Secondary | ICD-10-CM | POA: Diagnosis not present

## 2015-10-30 DIAGNOSIS — L97811 Non-pressure chronic ulcer of other part of right lower leg limited to breakdown of skin: Secondary | ICD-10-CM | POA: Diagnosis not present

## 2015-10-30 DIAGNOSIS — Z885 Allergy status to narcotic agent status: Secondary | ICD-10-CM | POA: Diagnosis not present

## 2015-10-30 DIAGNOSIS — Z880 Allergy status to penicillin: Secondary | ICD-10-CM | POA: Diagnosis not present

## 2015-10-30 DIAGNOSIS — I83018 Varicose veins of right lower extremity with ulcer other part of lower leg: Secondary | ICD-10-CM | POA: Diagnosis not present

## 2015-11-02 DIAGNOSIS — L97211 Non-pressure chronic ulcer of right calf limited to breakdown of skin: Secondary | ICD-10-CM | POA: Diagnosis not present

## 2015-11-02 DIAGNOSIS — I872 Venous insufficiency (chronic) (peripheral): Secondary | ICD-10-CM | POA: Diagnosis not present

## 2015-11-02 DIAGNOSIS — M810 Age-related osteoporosis without current pathological fracture: Secondary | ICD-10-CM | POA: Diagnosis not present

## 2015-11-02 DIAGNOSIS — L97221 Non-pressure chronic ulcer of left calf limited to breakdown of skin: Secondary | ICD-10-CM | POA: Diagnosis not present

## 2015-11-05 DIAGNOSIS — I872 Venous insufficiency (chronic) (peripheral): Secondary | ICD-10-CM | POA: Diagnosis not present

## 2015-11-05 DIAGNOSIS — M810 Age-related osteoporosis without current pathological fracture: Secondary | ICD-10-CM | POA: Diagnosis not present

## 2015-11-05 DIAGNOSIS — L97221 Non-pressure chronic ulcer of left calf limited to breakdown of skin: Secondary | ICD-10-CM | POA: Diagnosis not present

## 2015-11-05 DIAGNOSIS — L97211 Non-pressure chronic ulcer of right calf limited to breakdown of skin: Secondary | ICD-10-CM | POA: Diagnosis not present

## 2015-11-07 DIAGNOSIS — M810 Age-related osteoporosis without current pathological fracture: Secondary | ICD-10-CM | POA: Diagnosis not present

## 2015-11-07 DIAGNOSIS — L97211 Non-pressure chronic ulcer of right calf limited to breakdown of skin: Secondary | ICD-10-CM | POA: Diagnosis not present

## 2015-11-07 DIAGNOSIS — L97221 Non-pressure chronic ulcer of left calf limited to breakdown of skin: Secondary | ICD-10-CM | POA: Diagnosis not present

## 2015-11-07 DIAGNOSIS — I872 Venous insufficiency (chronic) (peripheral): Secondary | ICD-10-CM | POA: Diagnosis not present

## 2015-11-14 DIAGNOSIS — L97221 Non-pressure chronic ulcer of left calf limited to breakdown of skin: Secondary | ICD-10-CM | POA: Diagnosis not present

## 2015-11-14 DIAGNOSIS — M810 Age-related osteoporosis without current pathological fracture: Secondary | ICD-10-CM | POA: Diagnosis not present

## 2015-11-14 DIAGNOSIS — L97211 Non-pressure chronic ulcer of right calf limited to breakdown of skin: Secondary | ICD-10-CM | POA: Diagnosis not present

## 2015-11-14 DIAGNOSIS — I872 Venous insufficiency (chronic) (peripheral): Secondary | ICD-10-CM | POA: Diagnosis not present

## 2015-11-21 DIAGNOSIS — L97211 Non-pressure chronic ulcer of right calf limited to breakdown of skin: Secondary | ICD-10-CM | POA: Diagnosis not present

## 2015-11-21 DIAGNOSIS — M810 Age-related osteoporosis without current pathological fracture: Secondary | ICD-10-CM | POA: Diagnosis not present

## 2015-11-21 DIAGNOSIS — I872 Venous insufficiency (chronic) (peripheral): Secondary | ICD-10-CM | POA: Diagnosis not present

## 2015-11-21 DIAGNOSIS — L97221 Non-pressure chronic ulcer of left calf limited to breakdown of skin: Secondary | ICD-10-CM | POA: Diagnosis not present

## 2015-11-27 DIAGNOSIS — I872 Venous insufficiency (chronic) (peripheral): Secondary | ICD-10-CM | POA: Diagnosis not present

## 2015-11-27 DIAGNOSIS — I83018 Varicose veins of right lower extremity with ulcer other part of lower leg: Secondary | ICD-10-CM | POA: Diagnosis not present

## 2015-11-27 DIAGNOSIS — L97811 Non-pressure chronic ulcer of other part of right lower leg limited to breakdown of skin: Secondary | ICD-10-CM | POA: Diagnosis not present

## 2015-11-28 DIAGNOSIS — L97211 Non-pressure chronic ulcer of right calf limited to breakdown of skin: Secondary | ICD-10-CM | POA: Diagnosis not present

## 2015-11-28 DIAGNOSIS — I872 Venous insufficiency (chronic) (peripheral): Secondary | ICD-10-CM | POA: Diagnosis not present

## 2015-11-28 DIAGNOSIS — M810 Age-related osteoporosis without current pathological fracture: Secondary | ICD-10-CM | POA: Diagnosis not present

## 2015-11-28 DIAGNOSIS — L97221 Non-pressure chronic ulcer of left calf limited to breakdown of skin: Secondary | ICD-10-CM | POA: Diagnosis not present

## 2015-12-05 DIAGNOSIS — M810 Age-related osteoporosis without current pathological fracture: Secondary | ICD-10-CM | POA: Diagnosis not present

## 2015-12-05 DIAGNOSIS — L97211 Non-pressure chronic ulcer of right calf limited to breakdown of skin: Secondary | ICD-10-CM | POA: Diagnosis not present

## 2015-12-05 DIAGNOSIS — I872 Venous insufficiency (chronic) (peripheral): Secondary | ICD-10-CM | POA: Diagnosis not present

## 2015-12-05 DIAGNOSIS — L97221 Non-pressure chronic ulcer of left calf limited to breakdown of skin: Secondary | ICD-10-CM | POA: Diagnosis not present

## 2015-12-11 DIAGNOSIS — L97221 Non-pressure chronic ulcer of left calf limited to breakdown of skin: Secondary | ICD-10-CM | POA: Diagnosis not present

## 2015-12-11 DIAGNOSIS — I872 Venous insufficiency (chronic) (peripheral): Secondary | ICD-10-CM | POA: Diagnosis not present

## 2015-12-11 DIAGNOSIS — L97211 Non-pressure chronic ulcer of right calf limited to breakdown of skin: Secondary | ICD-10-CM | POA: Diagnosis not present

## 2015-12-11 DIAGNOSIS — M810 Age-related osteoporosis without current pathological fracture: Secondary | ICD-10-CM | POA: Diagnosis not present

## 2015-12-18 DIAGNOSIS — I872 Venous insufficiency (chronic) (peripheral): Secondary | ICD-10-CM | POA: Diagnosis not present

## 2015-12-18 DIAGNOSIS — L97211 Non-pressure chronic ulcer of right calf limited to breakdown of skin: Secondary | ICD-10-CM | POA: Diagnosis not present

## 2015-12-18 DIAGNOSIS — L97221 Non-pressure chronic ulcer of left calf limited to breakdown of skin: Secondary | ICD-10-CM | POA: Diagnosis not present

## 2015-12-18 DIAGNOSIS — M810 Age-related osteoporosis without current pathological fracture: Secondary | ICD-10-CM | POA: Diagnosis not present

## 2015-12-26 DIAGNOSIS — L97221 Non-pressure chronic ulcer of left calf limited to breakdown of skin: Secondary | ICD-10-CM | POA: Diagnosis not present

## 2015-12-26 DIAGNOSIS — M810 Age-related osteoporosis without current pathological fracture: Secondary | ICD-10-CM | POA: Diagnosis not present

## 2015-12-26 DIAGNOSIS — L97211 Non-pressure chronic ulcer of right calf limited to breakdown of skin: Secondary | ICD-10-CM | POA: Diagnosis not present

## 2015-12-26 DIAGNOSIS — I872 Venous insufficiency (chronic) (peripheral): Secondary | ICD-10-CM | POA: Diagnosis not present

## 2015-12-31 DIAGNOSIS — I872 Venous insufficiency (chronic) (peripheral): Secondary | ICD-10-CM | POA: Diagnosis not present

## 2015-12-31 DIAGNOSIS — M810 Age-related osteoporosis without current pathological fracture: Secondary | ICD-10-CM | POA: Diagnosis not present

## 2015-12-31 DIAGNOSIS — L97221 Non-pressure chronic ulcer of left calf limited to breakdown of skin: Secondary | ICD-10-CM | POA: Diagnosis not present

## 2015-12-31 DIAGNOSIS — L97211 Non-pressure chronic ulcer of right calf limited to breakdown of skin: Secondary | ICD-10-CM | POA: Diagnosis not present

## 2016-01-01 DIAGNOSIS — I872 Venous insufficiency (chronic) (peripheral): Secondary | ICD-10-CM | POA: Diagnosis not present

## 2016-01-01 DIAGNOSIS — L97211 Non-pressure chronic ulcer of right calf limited to breakdown of skin: Secondary | ICD-10-CM | POA: Diagnosis not present

## 2016-01-01 DIAGNOSIS — M810 Age-related osteoporosis without current pathological fracture: Secondary | ICD-10-CM | POA: Diagnosis not present

## 2016-01-01 DIAGNOSIS — L97221 Non-pressure chronic ulcer of left calf limited to breakdown of skin: Secondary | ICD-10-CM | POA: Diagnosis not present

## 2016-01-02 DIAGNOSIS — L97828 Non-pressure chronic ulcer of other part of left lower leg with other specified severity: Secondary | ICD-10-CM | POA: Diagnosis not present

## 2016-01-02 DIAGNOSIS — L97811 Non-pressure chronic ulcer of other part of right lower leg limited to breakdown of skin: Secondary | ICD-10-CM | POA: Diagnosis not present

## 2016-01-02 DIAGNOSIS — I83018 Varicose veins of right lower extremity with ulcer other part of lower leg: Secondary | ICD-10-CM | POA: Diagnosis not present

## 2016-01-02 DIAGNOSIS — L97818 Non-pressure chronic ulcer of other part of right lower leg with other specified severity: Secondary | ICD-10-CM | POA: Diagnosis not present

## 2016-01-02 DIAGNOSIS — I872 Venous insufficiency (chronic) (peripheral): Secondary | ICD-10-CM | POA: Diagnosis not present

## 2016-01-08 DIAGNOSIS — L97221 Non-pressure chronic ulcer of left calf limited to breakdown of skin: Secondary | ICD-10-CM | POA: Diagnosis not present

## 2016-01-08 DIAGNOSIS — I872 Venous insufficiency (chronic) (peripheral): Secondary | ICD-10-CM | POA: Diagnosis not present

## 2016-01-08 DIAGNOSIS — L97211 Non-pressure chronic ulcer of right calf limited to breakdown of skin: Secondary | ICD-10-CM | POA: Diagnosis not present

## 2016-01-08 DIAGNOSIS — M810 Age-related osteoporosis without current pathological fracture: Secondary | ICD-10-CM | POA: Diagnosis not present

## 2016-01-15 DIAGNOSIS — L97211 Non-pressure chronic ulcer of right calf limited to breakdown of skin: Secondary | ICD-10-CM | POA: Diagnosis not present

## 2016-01-15 DIAGNOSIS — I872 Venous insufficiency (chronic) (peripheral): Secondary | ICD-10-CM | POA: Diagnosis not present

## 2016-01-15 DIAGNOSIS — M810 Age-related osteoporosis without current pathological fracture: Secondary | ICD-10-CM | POA: Diagnosis not present

## 2016-01-15 DIAGNOSIS — L97221 Non-pressure chronic ulcer of left calf limited to breakdown of skin: Secondary | ICD-10-CM | POA: Diagnosis not present

## 2016-01-22 DIAGNOSIS — L97221 Non-pressure chronic ulcer of left calf limited to breakdown of skin: Secondary | ICD-10-CM | POA: Diagnosis not present

## 2016-01-22 DIAGNOSIS — I872 Venous insufficiency (chronic) (peripheral): Secondary | ICD-10-CM | POA: Diagnosis not present

## 2016-01-22 DIAGNOSIS — L97211 Non-pressure chronic ulcer of right calf limited to breakdown of skin: Secondary | ICD-10-CM | POA: Diagnosis not present

## 2016-01-22 DIAGNOSIS — M810 Age-related osteoporosis without current pathological fracture: Secondary | ICD-10-CM | POA: Diagnosis not present

## 2016-01-30 DIAGNOSIS — L97221 Non-pressure chronic ulcer of left calf limited to breakdown of skin: Secondary | ICD-10-CM | POA: Diagnosis not present

## 2016-01-30 DIAGNOSIS — M810 Age-related osteoporosis without current pathological fracture: Secondary | ICD-10-CM | POA: Diagnosis not present

## 2016-01-30 DIAGNOSIS — L97211 Non-pressure chronic ulcer of right calf limited to breakdown of skin: Secondary | ICD-10-CM | POA: Diagnosis not present

## 2016-01-30 DIAGNOSIS — I872 Venous insufficiency (chronic) (peripheral): Secondary | ICD-10-CM | POA: Diagnosis not present

## 2016-02-05 DIAGNOSIS — L97811 Non-pressure chronic ulcer of other part of right lower leg limited to breakdown of skin: Secondary | ICD-10-CM | POA: Diagnosis not present

## 2016-02-05 DIAGNOSIS — I872 Venous insufficiency (chronic) (peripheral): Secondary | ICD-10-CM | POA: Diagnosis not present

## 2016-02-05 DIAGNOSIS — I83018 Varicose veins of right lower extremity with ulcer other part of lower leg: Secondary | ICD-10-CM | POA: Diagnosis not present

## 2016-02-14 DIAGNOSIS — I872 Venous insufficiency (chronic) (peripheral): Secondary | ICD-10-CM | POA: Diagnosis not present

## 2016-02-14 DIAGNOSIS — L97211 Non-pressure chronic ulcer of right calf limited to breakdown of skin: Secondary | ICD-10-CM | POA: Diagnosis not present

## 2016-02-14 DIAGNOSIS — L97221 Non-pressure chronic ulcer of left calf limited to breakdown of skin: Secondary | ICD-10-CM | POA: Diagnosis not present

## 2016-02-14 DIAGNOSIS — M810 Age-related osteoporosis without current pathological fracture: Secondary | ICD-10-CM | POA: Diagnosis not present

## 2016-02-22 DIAGNOSIS — L97221 Non-pressure chronic ulcer of left calf limited to breakdown of skin: Secondary | ICD-10-CM | POA: Diagnosis not present

## 2016-02-22 DIAGNOSIS — M810 Age-related osteoporosis without current pathological fracture: Secondary | ICD-10-CM | POA: Diagnosis not present

## 2016-02-22 DIAGNOSIS — I872 Venous insufficiency (chronic) (peripheral): Secondary | ICD-10-CM | POA: Diagnosis not present

## 2016-02-22 DIAGNOSIS — L97211 Non-pressure chronic ulcer of right calf limited to breakdown of skin: Secondary | ICD-10-CM | POA: Diagnosis not present

## 2016-02-28 DIAGNOSIS — M810 Age-related osteoporosis without current pathological fracture: Secondary | ICD-10-CM | POA: Diagnosis not present

## 2016-02-28 DIAGNOSIS — L97221 Non-pressure chronic ulcer of left calf limited to breakdown of skin: Secondary | ICD-10-CM | POA: Diagnosis not present

## 2016-02-28 DIAGNOSIS — L97211 Non-pressure chronic ulcer of right calf limited to breakdown of skin: Secondary | ICD-10-CM | POA: Diagnosis not present

## 2016-02-28 DIAGNOSIS — I872 Venous insufficiency (chronic) (peripheral): Secondary | ICD-10-CM | POA: Diagnosis not present

## 2016-03-04 DIAGNOSIS — I872 Venous insufficiency (chronic) (peripheral): Secondary | ICD-10-CM | POA: Diagnosis not present

## 2016-10-22 DIAGNOSIS — Z79899 Other long term (current) drug therapy: Secondary | ICD-10-CM | POA: Diagnosis not present

## 2016-10-22 DIAGNOSIS — M2550 Pain in unspecified joint: Secondary | ICD-10-CM | POA: Diagnosis not present

## 2016-10-22 DIAGNOSIS — E559 Vitamin D deficiency, unspecified: Secondary | ICD-10-CM | POA: Diagnosis not present

## 2016-10-22 DIAGNOSIS — R5383 Other fatigue: Secondary | ICD-10-CM | POA: Diagnosis not present

## 2016-10-22 DIAGNOSIS — I872 Venous insufficiency (chronic) (peripheral): Secondary | ICD-10-CM | POA: Diagnosis not present

## 2016-10-22 DIAGNOSIS — R7301 Impaired fasting glucose: Secondary | ICD-10-CM | POA: Diagnosis not present

## 2016-10-22 DIAGNOSIS — S31809A Unspecified open wound of unspecified buttock, initial encounter: Secondary | ICD-10-CM | POA: Diagnosis not present

## 2016-10-22 DIAGNOSIS — L853 Xerosis cutis: Secondary | ICD-10-CM | POA: Diagnosis not present

## 2016-10-22 DIAGNOSIS — E119 Type 2 diabetes mellitus without complications: Secondary | ICD-10-CM | POA: Diagnosis not present

## 2016-10-22 DIAGNOSIS — Z0000 Encounter for general adult medical examination without abnormal findings: Secondary | ICD-10-CM | POA: Diagnosis not present

## 2016-10-24 ENCOUNTER — Other Ambulatory Visit: Payer: Self-pay

## 2016-10-24 NOTE — Patient Outreach (Signed)
[facility]) Care Management  10/24/2016  Kathryn SeenRuth J Page December 05, 1925 [id number]  TELEPHONE SCREENING Referral date: 10/23/16 Referral source: primary MD  Referral reason: Multiple falls, caregivers need support/ education, caregiver fatigue Insurance: Medicare/ Medicaid  Telephone call to patient regarding primary MD referral. Unable to reach patient.  HIPAA compliant voice message left with call back phone number.  PLAN: RNCM will attempt 2nd telephone call to patient within 1 week.   [name] RN,BSN,CCM [facility] Telephonic  [phone]

## 2016-10-27 ENCOUNTER — Other Ambulatory Visit: Payer: Self-pay

## 2016-10-27 ENCOUNTER — Other Ambulatory Visit: Payer: Self-pay | Admitting: *Deleted

## 2016-10-27 NOTE — Patient Outreach (Signed)
[facility]) Care Management  10/27/2016  Orvan SeenRuth J Page [birth year] [id number]   TELEPHONE SCREENING Referral date: 10/23/16 Referral source: primary MD  Referral reason: Venous insufficieny, multiple falls, caregiver needs support/ education  Insurance: Medicare / Medicaid Patient gives verbal authorization to speak with her son [name] regarding all of her personal health information.  Patient states [name] is her health care power of attorney  SUBJECTIVE: Telephone call to patient regarding primary MD referral. HIPAA verified with patient.  Patient and son [name] on the line for call.  RNCM explained [facility] care management services. Patient and son verbalize agreement to receive services. Son states patient had two falls within 8 days.  States patient had issues with her right leg going numb and difficulty moving. Son states patient is wheelchair bound.  Son states patient was seen at her doctors office on 10/23/11.  Son states patient has fluid in her left leg and some in her right.  Son states left leg is worse than right. Son states the doctor gave patient prescription for a fluid pill which patient started taking today. Son states patient has a follow up scheduled with her primary doctor on 8/27 or 8/28/ 2018. Son states patient lives alone but has someone there with her most of the time. Son states he is patients primary care giver with the help of his wife and children. Son states patient requires assistance with bathing, toileting, and transfers. Son states patient has a couple of skin breakdown areas between her legs. Son states patients doctor is aware.  States he is applying cream and gauze to areas as advised by the doctor.    ASSESSMENT; [facility] benefit [facility] to assess fall risk/ safety.  Education regarding venous insufficiency diagnosis.  Patient will benefit from referral to social worker for community  resourse.  PROVIDERS: Dr. Ian MalkinZach hall  SOCIAL/ SUPPORTIVE CARE:  Son [name]  PLAN: [facility]  Will refer patient to community case manager and Child psychotherapistsocial worker.   Kathryn InaDavina Zackaria Burkey RN,BSN,CCM [facility] Telephonic  [phone]

## 2016-10-27 NOTE — Patient Outreach (Signed)
[facility]) Care Management  10/27/2016  [facility]van [facility]Ruth J [facility] Dec 12, 1925 [id number]  Referral received today for community case management after outreach by our telephonic case manager in response to provider request for case management services for [name] for multiple falls and caregiver support.   Our telephonic case manager received permission to speak with patient's son [name] who is her primary caregiver and health care power of attorney.   [name] was last seen by her primary care provider, Dr. Dwana MelenaZack Hall, on 10/22/16. [name] is wheelchair bound and requires assistance with bathing, toileting, and transfers. [name]'s son, daughter in law, and children all help provide care to [name].   [name] has had 2 falls recently in a span of 8 days. She has skin breakdown on her legs and has recently developed peripheral edema. [name] was seem by her primary care provider, Dr. Dwana MelenaZack Hall on 10/22/16. He prescribed a diuretic which was started today. In addition, Dr. Margo AyeHall advised the application of a cream and dressing for the skin breakdown. [name] is scheduled to see Dr. Margo AyeHall in follow up on 11/11/16.   Plan: I will reach out to [name] and her family first thing tomorrow morning in hopes of scheduling a home visit this week.    [name] MHA,BSN,RN,CCM [facility] Care Management  [phone]

## 2016-10-28 ENCOUNTER — Other Ambulatory Visit: Payer: Self-pay | Admitting: *Deleted

## 2016-10-28 NOTE — Patient Outreach (Signed)
[facility]) Care Management  10/28/2016  [name] 17-Apr-1925 [id number]  Referral received today for community case management after outreach by our telephonic case manager in response to provider request for case management services for [name] for multiple falls and caregiver support.   I reached out to the home of [name] today by phone, leaving a message, and received a return call from [name]'s son and DPR (designated patient representative) who gladly accepted an offer for me to visit with [name] at home on Friday @ 10:30am.   Plan: I will see [name] in person at her home on Friday 10/31/16 @ 10:30am.   Marja Kayslisa Gilboy MHA,BSN,RN,CCM [facility] Care Management  [phone]

## 2016-10-31 ENCOUNTER — Other Ambulatory Visit: Payer: Self-pay | Admitting: *Deleted

## 2016-10-31 DIAGNOSIS — D649 Anemia, unspecified: Secondary | ICD-10-CM | POA: Diagnosis not present

## 2016-10-31 DIAGNOSIS — M810 Age-related osteoporosis without current pathological fracture: Secondary | ICD-10-CM | POA: Diagnosis not present

## 2016-10-31 DIAGNOSIS — R7301 Impaired fasting glucose: Secondary | ICD-10-CM | POA: Diagnosis not present

## 2016-10-31 DIAGNOSIS — E559 Vitamin D deficiency, unspecified: Secondary | ICD-10-CM | POA: Diagnosis not present

## 2016-10-31 DIAGNOSIS — S81802A Unspecified open wound, left lower leg, initial encounter: Secondary | ICD-10-CM | POA: Diagnosis not present

## 2016-10-31 DIAGNOSIS — R296 Repeated falls: Secondary | ICD-10-CM | POA: Diagnosis not present

## 2016-10-31 DIAGNOSIS — I872 Venous insufficiency (chronic) (peripheral): Secondary | ICD-10-CM | POA: Diagnosis not present

## 2016-10-31 DIAGNOSIS — R946 Abnormal results of thyroid function studies: Secondary | ICD-10-CM | POA: Diagnosis not present

## 2016-10-31 NOTE — Patient Outreach (Signed)
[facility]) Care Management   10/31/2016  [name] June 22, 1925 [id number]  [name] is an 83 y.o. female who was referred to [facility] for case management services after being seen in the office on 10/22/16 with complaints of skin condition and falls.   Subjective: "My legs are just dripping"  Objective:  BP 108/66   Pulse (!) 101   SpO2 94%   Review of Systems  Constitutional: Negative for fever.       Generalized severe deconditioning; wheelchair bound; unable to stand without assistance  HENT: Negative.   Eyes: Negative.   Respiratory: Negative for cough, sputum production, shortness of breath and wheezing.   Cardiovascular: Negative for chest pain and palpitations.  Gastrointestinal: Negative for constipation, diarrhea, heartburn, nausea and vomiting.  Genitourinary:       Incontinent of urine  Musculoskeletal: Positive for falls and myalgias.  Skin:       Lymphedema noted bilateral lower extremities with weeping L > R  Neurological: Positive for focal weakness and weakness. Negative for dizziness.       Contracture of right leg at knee prevents weightbearing on right lower extremity  Psychiatric/Behavioral: Negative.     Physical Exam  Constitutional: She is oriented to person, place, and time. Vital signs are normal. She has a sickly appearance. She does not appear ill. No distress.  Cardiovascular: Normal rate and regular rhythm.   Murmur heard.  Systolic murmur is present with a grade of 3/6  Respiratory: Effort normal and breath sounds normal. She has no wheezes. She has no rhonchi. She has no rales.  GI: Soft. Bowel sounds are normal. She exhibits no distension. There is no tenderness.  Musculoskeletal:       Right knee: She exhibits decreased range of motion.  Neurological: She is alert and oriented to person, place, and time.  Skin:     See ROS notation re: lymphedema bilateral lower extremities  Psychiatric: She has a normal mood and  affect. Her speech is normal and behavior is normal. Judgment and thought content normal. Cognition and memory are normal.    Encounter Medications:   Outpatient Encounter Prescriptions as of 10/31/2016  Medication Sig Note  . calcium-vitamin D (OSCAL WITH D) 500-200 MG-UNIT per tablet Take 1 tablet by mouth 2 (two) times daily with a meal.   . Misc Natural Products (FLEX-A-MIN JOINT FLEX) TABS Take 1 tablet by mouth daily.   Marland Kitchen senna-docusate (SENOKOT S) 8.6-50 MG per tablet Take 1 tablet by mouth at bedtime.   . Turmeric Curcumin 500 MG CAPS Take 1 capsule by mouth daily as needed (for pain).   Marland Kitchen enoxaparin (LOVENOX) 40 MG/0.4ML injection Inject 0.4 mLs (40 mg total) into the skin daily. (Patient not taking: Reported on 10/31/2016)   . feeding supplement, ENSURE COMPLETE, (ENSURE COMPLETE) LIQD Take 237 mLs by mouth 3 (three) times daily between meals. (Patient not taking: Reported on 10/31/2016)   . traMADol (ULTRAM) 50 MG tablet Take 0.5 tablets (25 mg total) by mouth every 6 (six) hours as needed for moderate pain. (Patient not taking: Reported on 10/31/2016) 10/31/2016: Has on hand; takes prn   No facility-administered encounter medications on file as of 10/31/2016.     Functional Status:   In your present state of health, do you have any difficulty performing the following activities: 10/31/2016  Hearing? N  Vision? N  Difficulty concentrating or making decisions? Y  Walking or climbing stairs? Y  Comment wheelchair bound  Dressing  or bathing? Y  Doing errands, shopping? Y  Preparing Food and eating ? Y  Using the Toilet? Y  In the past six months, have you accidently leaked urine? Y  Do you have problems with loss of bowel control? Y  Comment occasionally  Managing your Medications? Y  Managing your Finances? Y  Housekeeping or managing your Housekeeping? Y  Some recent data might be hidden    Fall/Depression Screening:    Fall Risk  10/31/2016  Falls in the past year? Yes    Number falls in past yr: 2 or more  Injury with Fall? Yes  Risk Factor Category  High Fall Risk  Risk for fall due to : History of fall(s);Impaired balance/gait;Impaired mobility  Follow up Falls evaluation completed;Education provided;Falls prevention discussed   PHQ 2/9 Scores 10/27/2016  PHQ - 2 Score 1    Assessment:  [name] and very cognitively sharp 83 year old lady living in [place] in her home where her son and daughter in law reside with her to help provide for her increasing care needs. She has recently had more falls and worsening skin condition. She has nutritional deficits.   Home Safety/Fall Risk - [name] has recently had falls at home; today she explains that her right leg no longer bends at the knee and when she was trying recently to straighten her knee, she lost her balance and fell from her chair; she sustained what appears to have been an abrasion over the right wrist but denies other injury; today we discussed fall risk reduction strategies; [name] is bed/chair bound and is fully dependent upon her son and daughter in law for hands on care; she is able to feed her self but food must be prepared for her   Acute Skin Condition - [name] was recently prescribed torsemide x 5 days (w/ potassium supplementation) for treatment of edema/swelling of bilateral lower extremities; she took the 5 day course as prescribed; the condition of the skin of the bilateral lower extremities is discolored, verrucous in areas, with significant dermal thickening on the lateral blades of both feet; she has open vesicles on the posterior surface of the left lower leg which is weeping. She and her son report that her legs weep enough daily that she has to put newspaper on the floor underneath her feet to catch the fluid   Recommendations:  1) HHPT evaluation for lymphedema treatment in the home; patient would benefit from compression therapy in some form (manual vs compression  stockings or sleeves) 2) GNP referral [facility] CM staff) for possible soaks for treatment of dermal thickening and for treatment of onychomycosis  Nutrition Concerns - Mrs. Salvemini is only eating one meal daily; given her skin/wound healing needs, I would surmise that she has a significant protein calorie deficit. She has tried Ensure and doesn't really recall liking it but is willing try try it again; she would also like to try Boost; I provided coupons to her son for both of these nutrition supplements  Recommendations: Order for Nutrition supplements once patient indicates preference  Swallowing Concerns - Mrs. Kosta reports today that she occasionally has difficulty with swallowing; I reviewed swallowing safety recommending small bites followed by small sips of liquid to help her swallow; I recommended that she avoid use of straw and that she always sit upright when eating or drinking and preferably that she has supervision when eating/drinking; I reviewed signs and symptoms of disordered swallowing and advised that the patient/son/caregiver call for  noted increase in difficulty swallowing or choking/excessive coughing or fever.   Recommendation: HHST for swallowing eval and recommendations for aspiration precautions/dietary modifications  Plan:   I will refer Mrs. Rocca to our [facility] who can make recommendations/treatment re: foot care  I will discuss Mrs. Sheets needs with Dr. Margo Aye on Monday.   I will see Mrs. Orellana at home again next week.   Mrs. Blase will put into practice swallowing precautions as reviewed.   Mrs. Gitlin family will purchase nutrition supplements over the weekend for her to try.   During our visit next week, I will discuss with Mrs. Califf and her son whether she might be interested in [facility] services.   [facility] CM Care Plan Problem One     Most Recent Value  Care Plan Problem One  Home Safety/Fall Risk  Role Documenting the Problem One  Care Management Coordinator    Care Plan for Problem One  Active  THN Long Term Goal   Over the next 31 days, patient and/or family caregivers will verbalize understanding of plan of care for fall risk reduction  [facility] Long Term Goal Start Date  10/27/16  Interventions for Problem One Long Term Goal  reviewed fall risk reduction strategies with patient/son (caregiver)    [facility] CM Care Plan Problem Two     Most Recent Value  Care Plan Problem Two  Nutritional Deficits  Role Documenting the Problem Two  Care Management Coordinator  Care Plan for Problem Two  Active  Interventions for Problem Two Long Term Goal   Discussed current dietary intake/nutrition assessment performed,  notified provider of nutritional concerns  THN Long Term Goal  Over the next 31 days, patient and caregivers will verbalize understanding of plan of care for nutritional deficits  [facility]N Long Term Goal Start Date  10/31/16  [facility] CM Short Term Goal #1   Over the next 7 days, patient will try different nutritional supplements  [facility] CM Short Term Goal #1 Start Date  10/31/16  Interventions for Short Term Goal #2   offered coupons for different nutrition supplements,  instructed patient/caregiver on where supplements can be purchased  [facility] CM Short Term Goal #2   Over the next 30 days, swallowing concerns will be addressed by provider  [facility] CM Short Term Goal #2 Start Date  10/31/16  Interventions for Short Term Goal #2  notified provider of swallowing concerns,  recommendation for ST eval made,  reviewed aspiration precautions with patient/caregiver    [facility] CM Care Plan Problem Three     Most Recent Value  Care Plan Problem Three  Alterations in Skin Integrity/Need for treatment plan  Role Documenting the Problem Three  Care Management Coordinator  Care Plan for Problem Three  Active  THN Long Term Goal   Over the next 31 days, patient/caregivers will verbalize understanding of plan of care for treatment of skin conditions  THN Long Term Goal Start Date  10/31/16   Interventions for Problem Three Long Term Goal  physical assessment performed,  legs cleansed and dressing applied to left leg,  referral to [facility] for foot care  [facility] CM Short Term Goal #1   Over the next 7 days, patient/caregivers will work with HHPT as ordered for treatment of lower extremity skin condition  [facility] CM Short Term Goal #1 Start Date  10/31/16  Interventions for Short Term Goal #1  recommended HHPT eval   [facility] CM Short Term Goal #2   Over the next 14 days, patient  will work with [facility] re: treatment of podiatric skin conditions  [facility] CM Short Term Goal #2 Start Date  10/31/16  Interventions for Short Term Goal #2  [facility] referral made     Marja Kays [facility] Care Management  [phone]

## 2016-11-03 ENCOUNTER — Other Ambulatory Visit: Payer: Self-pay | Admitting: *Deleted

## 2016-11-03 NOTE — Patient Outreach (Signed)
[facility]) Care Management  11/03/2016  [name] [facility] 1925-08-07 [id number]  I reached out to the office of Dr Dwana Melena today to follow up on receipt of my assessment note from my Friday visit and request for consideration of orders for the following:  1) HHPT - lymphedema therapy  2) HHST - swallowing evaluation    Plan: I will follow up tomorrow and am scheduled to see [name] at home again on Thursday.    Marja Kays MHA,BSN,RN,CCM [facility] Care Management  [phone]

## 2016-11-05 DIAGNOSIS — E559 Vitamin D deficiency, unspecified: Secondary | ICD-10-CM | POA: Diagnosis not present

## 2016-11-05 DIAGNOSIS — R946 Abnormal results of thyroid function studies: Secondary | ICD-10-CM | POA: Diagnosis not present

## 2016-11-05 DIAGNOSIS — E46 Unspecified protein-calorie malnutrition: Secondary | ICD-10-CM | POA: Diagnosis not present

## 2016-11-05 DIAGNOSIS — R7301 Impaired fasting glucose: Secondary | ICD-10-CM | POA: Diagnosis not present

## 2016-11-05 DIAGNOSIS — I872 Venous insufficiency (chronic) (peripheral): Secondary | ICD-10-CM | POA: Diagnosis not present

## 2016-11-05 DIAGNOSIS — D649 Anemia, unspecified: Secondary | ICD-10-CM | POA: Diagnosis not present

## 2016-11-05 DIAGNOSIS — R600 Localized edema: Secondary | ICD-10-CM | POA: Diagnosis not present

## 2016-11-06 ENCOUNTER — Other Ambulatory Visit: Payer: Self-pay | Admitting: *Deleted

## 2016-11-06 NOTE — Patient Outreach (Signed)
[facility]) Care Management   11/06/2016  [name] [birth year] [id number]  [name] is an 83 y.o. female who was referred to [facility] for case management services after being seen in the office on 10/22/16 with complaints of skin condition and falls.  Subjective: "I want this leg to get better mostly."  Objective:  Review of Systems  Constitutional: Negative.   HENT: Positive for hearing loss.        Possible mild hearing loss  Eyes: Negative.   Respiratory: Positive for cough.        Intermittent cough when swallowing thin liquids or after eating  Cardiovascular: Positive for leg swelling. Negative for chest pain and palpitations.       Lymphedema of bilateral lower extremities  Gastrointestinal: Negative.   Genitourinary:       Incontinent of urine; wears undergarment  Musculoskeletal: Positive for myalgias. Negative for falls.  Skin:       See assessment note re: skin conditions  Neurological: Negative.   Psychiatric/Behavioral: Negative.     Physical Exam  Constitutional: She is oriented to person, place, and time. Vital signs are normal. She appears well-developed. She is active. She has a sickly appearance. She does not appear ill.  Cardiovascular: Normal rate and regular rhythm.   Respiratory: Effort normal and breath sounds normal. She has no wheezes. She has no rhonchi. She has no rales.  GI: Soft. Bowel sounds are normal. There is no tenderness.  Neurological: She is alert and oriented to person, place, and time.  Skin: Skin is warm.     Psychiatric: She has a normal mood and affect. Her speech is normal and behavior is normal. Judgment and thought content normal. Cognition and memory are normal.    Encounter Medications:   Outpatient Encounter Prescriptions as of 11/06/2016  Medication Sig Note  . calcium-vitamin D (OSCAL WITH D) 500-200 MG-UNIT per tablet Take 1 tablet by mouth 2 (two) times daily with a meal.   . enoxaparin (LOVENOX) 40  MG/0.4ML injection Inject 0.4 mLs (40 mg total) into the skin daily. (Patient not taking: Reported on 10/31/2016)   . feeding supplement, ENSURE COMPLETE, (ENSURE COMPLETE) LIQD Take 237 mLs by mouth 3 (three) times daily between meals. (Patient not taking: Reported on 10/31/2016)   . Misc Natural Products (FLEX-A-MIN JOINT FLEX) TABS Take 1 tablet by mouth daily.   Marland Kitchen senna-docusate (SENOKOT S) 8.6-50 MG per tablet Take 1 tablet by mouth at bedtime.   . traMADol (ULTRAM) 50 MG tablet Take 0.5 tablets (25 mg total) by mouth every 6 (six) hours as needed for moderate pain. (Patient not taking: Reported on 10/31/2016) 10/31/2016: Has on hand; takes prn  . Turmeric Curcumin 500 MG CAPS Take 1 capsule by mouth daily as needed (for pain).    Assessment:   [name] living in [place] in her home where her son and daughter in law reside with her to help provide for her increasing care needs. She has recently had more falls and worsening skin condition. She has nutritional deficits.   I met briefly with [name] and her son today to review plans for various evaluations and interventions as outlined below:  Home Safety/Fall Risk - [name] has recently had falls at home related to contractures of the right knee, poor balance and immobility. I requested referral to home health for HHPT/OT which was ordered by Dr. Nevada Crane. I reached out to [facility]  [phone] @ [facility] home health to coordinate an appointment time.    Acute Skin Condition - [name] has lymphedema of bilateral lower extremities with open vesicles and weeping. Dr. Nevada Crane ordered home health nursing for skin assessment and dressing changes. I reached out to [facility] as outlined above to coordinate the initial home health visit date/time.   In addition, [name] has significant onychomycosis and dermal thickening of the feet. I requested evaluation by [facility] who will contact Mrs.  Page son to schedule a visit date/time.   Nutrition Concerns - [name] is only eating one meal daily; given her skin/wound healing needs, I would surmise that she has a significant protein calorie deficit. She has tried Ensure and doesn't really recall liking it but is willing try try it again; she would also like to try Boost; I provided coupons to her son for both of these nutrition supplements  Recommendations: Order for Nutrition supplements once patient indicates preference  Swallowing Concerns - [name] and her son report occasional difficulty with swallowing; I reviewed swallowing safety recommending small bites followed by small sips of liquid to help her swallow; I recommended that she avoid use of straw and that she always sit upright when eating or drinking and preferably that she has supervision when eating/drinking; I reviewed signs and symptoms of disordered swallowing and advised that the patient/son/caregiver call for noted increase in difficulty swallowing or choking/excessive coughing or fever.   Dr. Nevada Crane has ordered HHST. I explained the role of HHST to [name] and her son.   Plan: I will reach out to [name] next week to follow up on the visits as outlined above.    [facility]  [phone]

## 2016-11-06 NOTE — Patient Outreach (Signed)
[facility]) Care Management  11/06/2016  [name] [facility] 1925-04-11 [id number]   CSW received referral from [facility] Telephonic RNCM, [place] for caregiver support resources. CSW called & spoke with patient's son, [name] to schedule initial home visit for Monday, Aug 27th at 1pm. Full assessment & note to follow.    Lincoln Maxin, LCSW [facility]  Clinical Social Worker cell #: [phone]

## 2016-11-10 ENCOUNTER — Encounter: Payer: Self-pay | Admitting: *Deleted

## 2016-11-10 ENCOUNTER — Other Ambulatory Visit: Payer: Self-pay | Admitting: *Deleted

## 2016-11-10 DIAGNOSIS — R1312 Dysphagia, oropharyngeal phase: Secondary | ICD-10-CM | POA: Diagnosis not present

## 2016-11-10 DIAGNOSIS — L89322 Pressure ulcer of left buttock, stage 2: Secondary | ICD-10-CM | POA: Diagnosis not present

## 2016-11-10 DIAGNOSIS — L97822 Non-pressure chronic ulcer of other part of left lower leg with fat layer exposed: Secondary | ICD-10-CM | POA: Diagnosis not present

## 2016-11-10 DIAGNOSIS — I872 Venous insufficiency (chronic) (peripheral): Secondary | ICD-10-CM | POA: Diagnosis not present

## 2016-11-10 DIAGNOSIS — M6281 Muscle weakness (generalized): Secondary | ICD-10-CM | POA: Diagnosis not present

## 2016-11-10 DIAGNOSIS — L97812 Non-pressure chronic ulcer of other part of right lower leg with fat layer exposed: Secondary | ICD-10-CM | POA: Diagnosis not present

## 2016-11-10 NOTE — Initial Assessments (Signed)
Acute home visit for foot care and wound assessment.  [name] is seated in her wheelchair and her caregiver, son, [name], is by her side. She asks what causes the swelling and weeping of the fluids in her legs? She asks what causes her skin to be so dry and scaley? She reports she has a sore on her inner upper thigh and it hurts her when she scoots around in her chair.  O: Pt is awake, alert, oriented and making jokes. He is cooperative with my examination.      Extremities have 4 + edema to upper calf. She has 1+ pedal, posterior tibial and popliteal. Her lower extrmity skin is tough with thicken scales, the lateral aspect of her feet have thickened,       layers of skin. Pt has wrapped gauze around her LLE to soak up the serous fluid leaking through her skin. Her toenails are long and oncychomycotic.  She has 2 wounds in the perineal area       that a quite denuded, each is about 2X2 inches in diameter. These areas may be caused from the friction of her incontinence briefs and moisture.  A:  Onychomycotic tonails      Gross bilateral leg edema      Skin changes do to severe dry skin and circulation      Shear wound Right groin area  P: I debrided Pt's toenails which she tolerated well. I instructed them on good foot hygiene with special attentions to using a rough washcloth to wash off some of the dead skin and to make sure       her toes are cleaned well between each one.       Suggested Vicks Vapor Rub be applied to her toenails daily.       Suggested heavy use of Eucerin cream or the [employer] brand to coat the lower extremities daily.       Discussed the need for Mr. Talluto to sit in the recliner with her legs and feet propped up and her knees to be supported.        Also she is to stop wearing the briefs while she is at home so her wounds can heal. They can apply from Vitamin A and D to these areas. Pt should sit with her knees apart and a sheet       covering her so some air can get to  her perineal area.       She needs some gentle ROM exercises to her knees and hips to keep them from contracting further.        I will order her 2 pairs of medium pressure stockings that her son can apply daily to help reduce the edema, BUT THE MAIN this is allowing gravity to help.        Her bed side commode should be placed next to her and she should set an alarm to get up and void hourly so she will not soil her underpad and therefore she is kept dry.       I will collaborate with Serina Cowper if any further interventions on my part are needed.  [name]. Burgess Estelle, MSN, [facility] Gerontological Nurse Practitioner [facility] Care Management [phone]

## 2016-11-10 NOTE — Patient Outreach (Signed)
[facility]) Care Management  [facility] Social Work  11/10/2016  [name] August 28, 1925 [id number]   Encounter Medications:  Outpatient Encounter Prescriptions as of 11/10/2016  Medication Sig Note  . calcium-vitamin D (OSCAL WITH D) 500-200 MG-UNIT per tablet Take 1 tablet by mouth 2 (two) times daily with a meal.   . enoxaparin (LOVENOX) 40 MG/0.4ML injection Inject 0.4 mLs (40 mg total) into the skin daily. (Patient not taking: Reported on 10/31/2016)   . feeding supplement, ENSURE COMPLETE, (ENSURE COMPLETE) LIQD Take 237 mLs by mouth 3 (three) times daily between meals. (Patient not taking: Reported on 10/31/2016)   . Misc Natural Products (FLEX-A-MIN JOINT FLEX) TABS Take 1 tablet by mouth daily.   Marland Kitchen senna-docusate (SENOKOT S) 8.6-50 MG per tablet Take 1 tablet by mouth at bedtime.   . traMADol (ULTRAM) 50 MG tablet Take 0.5 tablets (25 mg total) by mouth every 6 (six) hours as needed for moderate pain. (Patient not taking: Reported on 10/31/2016) 10/31/2016: Has on hand; takes prn  . Turmeric Curcumin 500 MG CAPS Take 1 capsule by mouth daily as needed (for pain).    No facility-administered encounter medications on file as of 11/10/2016.     Functional Status:  In your present state of health, do you have any difficulty performing the following activities: 10/31/2016  Hearing? N  Vision? N  Difficulty concentrating or making decisions? Y  Walking or climbing stairs? Y  Comment wheelchair bound  Dressing or bathing? Y  Doing errands, shopping? Y  Preparing Food and eating ? Y  Using the Toilet? Y  In the past six months, have you accidently leaked urine? Y  Do you have problems with loss of bowel control? Y  Comment occasionally  Managing your Medications? Y  Managing your Finances? Y  Housekeeping or managing your Housekeeping? Y  Some recent data might be hidden    Fall/Depression Screening:  PHQ 2/9 Scores 11/10/2016 10/27/2016  PHQ - 2 Score 0 1     Assessment:  CSW had received referral from [place], [place] for caregiver support. CSW met with patient & son, Evelena Peat at patient's home this afternoon to complete initial assessment. Patient's son and his wife are patient's primary caregivers, though they do not live with patient - they are very active and involved in patient's care - providing meals, transportation to doctors appointments and hands on care. CSW provided son with information on Aging, [place] and encouraged him to call & setup appointment to complete CAP Librarian, academic) application and inquire about transportation. Currently, patient's son is able to transport patient to her doctors appointments but as her mobility worsens, he can foresee that she will likely need wheelchair Lucianne Lei for transportation in the near future.   CSW spoke with patient & son about PACE program (Program for [place] for the Elderly) but patient states that she would rather not have to change doctors as she really likes Dr. Wende Neighbors. CSW provided son with brochure for [place] patient changes her mind.   Plan:   CSW will check back within 2 weeks to ensure that patient's son has had a chance to visit [facility] to complete CAP application.   [facility] CM Care Plan Problem One     Most Recent Value  Care Plan Problem One  Community Resources  Role Documenting the Problem One  Clinical Social Worker  Care Plan for Problem One  Active  Community [facility]  Long Term Goal   Over the next 45 days, patient's son will have visited Aging, [place] to complete CAP application and be connected with community resources provided  [facility] Long Term Goal Start Date  11/10/16  Interventions for Problem One Long Term Goal  CSW provided brochure and explained process to patient/son about CAP & other community resources available      Raynaldo Opitz, [facility]  Clinical  Social Worker cell #: [phone]

## 2016-11-13 ENCOUNTER — Other Ambulatory Visit: Payer: Self-pay | Admitting: *Deleted

## 2016-11-13 NOTE — Patient Outreach (Signed)
[facility](THN) Care Management  11/13/2016  Sandrea HughsRuth J Cupp 26-Apr-1925 [id number]  Unable to reach [name] or her son by phone this afternoon to follow up on home health and [facility].   Plan: I will call [name] at home at the beginning of next week with plans to visit later in the week if she/her son agree.    Marja Kayslisa Deandrew Hoecker MHA,BSN,RN,CCM [facility] Care Management  [phone]

## 2016-11-14 DIAGNOSIS — L97812 Non-pressure chronic ulcer of other part of right lower leg with fat layer exposed: Secondary | ICD-10-CM | POA: Diagnosis not present

## 2016-11-14 DIAGNOSIS — I872 Venous insufficiency (chronic) (peripheral): Secondary | ICD-10-CM | POA: Diagnosis not present

## 2016-11-14 DIAGNOSIS — R1312 Dysphagia, oropharyngeal phase: Secondary | ICD-10-CM | POA: Diagnosis not present

## 2016-11-14 DIAGNOSIS — L89322 Pressure ulcer of left buttock, stage 2: Secondary | ICD-10-CM | POA: Diagnosis not present

## 2016-11-14 DIAGNOSIS — L97822 Non-pressure chronic ulcer of other part of left lower leg with fat layer exposed: Secondary | ICD-10-CM | POA: Diagnosis not present

## 2016-11-14 DIAGNOSIS — M6281 Muscle weakness (generalized): Secondary | ICD-10-CM | POA: Diagnosis not present

## 2016-11-18 ENCOUNTER — Other Ambulatory Visit: Payer: Self-pay | Admitting: *Deleted

## 2016-11-18 NOTE — Patient Outreach (Signed)
[facility](THN) Care Management  11/18/2016  Kathryn SeenRuth J Page [birth year] [id number]  Unable to reach [name] or her son by phone today to follow up on her home health services, lymphedema treatment, and care coordination needs.   Plan: I will reach out to [name] and her son later this week.    Marja Kayslisa Nykeria Mealing MHA,BSN,RN,CCM [facility] Care Management  [phone]

## 2016-11-19 DIAGNOSIS — I872 Venous insufficiency (chronic) (peripheral): Secondary | ICD-10-CM | POA: Diagnosis not present

## 2016-11-19 DIAGNOSIS — L97822 Non-pressure chronic ulcer of other part of left lower leg with fat layer exposed: Secondary | ICD-10-CM | POA: Diagnosis not present

## 2016-11-19 DIAGNOSIS — L97812 Non-pressure chronic ulcer of other part of right lower leg with fat layer exposed: Secondary | ICD-10-CM | POA: Diagnosis not present

## 2016-11-19 DIAGNOSIS — M6281 Muscle weakness (generalized): Secondary | ICD-10-CM | POA: Diagnosis not present

## 2016-11-19 DIAGNOSIS — L89322 Pressure ulcer of left buttock, stage 2: Secondary | ICD-10-CM | POA: Diagnosis not present

## 2016-11-19 DIAGNOSIS — R1312 Dysphagia, oropharyngeal phase: Secondary | ICD-10-CM | POA: Diagnosis not present

## 2016-11-20 ENCOUNTER — Other Ambulatory Visit: Payer: Self-pay | Admitting: *Deleted

## 2016-11-20 DIAGNOSIS — R1312 Dysphagia, oropharyngeal phase: Secondary | ICD-10-CM | POA: Diagnosis not present

## 2016-11-20 DIAGNOSIS — L97822 Non-pressure chronic ulcer of other part of left lower leg with fat layer exposed: Secondary | ICD-10-CM | POA: Diagnosis not present

## 2016-11-20 DIAGNOSIS — L97812 Non-pressure chronic ulcer of other part of right lower leg with fat layer exposed: Secondary | ICD-10-CM | POA: Diagnosis not present

## 2016-11-20 DIAGNOSIS — I872 Venous insufficiency (chronic) (peripheral): Secondary | ICD-10-CM | POA: Diagnosis not present

## 2016-11-20 DIAGNOSIS — L89322 Pressure ulcer of left buttock, stage 2: Secondary | ICD-10-CM | POA: Diagnosis not present

## 2016-11-20 DIAGNOSIS — M6281 Muscle weakness (generalized): Secondary | ICD-10-CM | POA: Diagnosis not present

## 2016-11-20 NOTE — Patient Outreach (Signed)
[facility]) Care Management  11/20/2016  India Jolin [facility] [birth year] [id number]  Call received from Mr. Quenna Doepke III, son and primary caregiver of [name]. Mr. Menard indicated that he and Mrs. Youtz were very pleased with the services being provided to them ([place], [place], [place], [place]) and felt that Mrs. Eisenberger was benefitting from these services. However, one at least one day since services were initiated, Mrs. Gaertner had all 4 disciplines visit on the same day which meant she didn't eat until 4pm on that day and felt exhausted to the point of being ill and sleeping through most of the next day. Mr. Longsworth emphasized his gratitude and that of his mother for the care and services but asked if I might help coordinate with the home care agency regarding scheduling of visits and spreading out the visits during the week so that Mrs. Ammon doesn't become overwhelmed with the number of visits per day.   I reached out to [facility] [phone] @ [facility] to collaborate with her on scheduling of home care visits. She expressed her genuine concern about making sure that scheduled services met the needs of Mrs. Kemmerer and did not overtax here. She stated that she would contact all members of Mrs. [facility] care team (from [facility]) to ask that they coordinate appointment dates/times so as not to overwhelm Mrs. Labuda.   Plan: I will see Mrs. Rawdon at home briefly next week to assess her progress and ongoing care coordination needs.    [place] [facility]  [phone]

## 2016-11-24 ENCOUNTER — Other Ambulatory Visit: Payer: Self-pay | Admitting: *Deleted

## 2016-11-24 DIAGNOSIS — L97812 Non-pressure chronic ulcer of other part of right lower leg with fat layer exposed: Secondary | ICD-10-CM | POA: Diagnosis not present

## 2016-11-24 DIAGNOSIS — R1312 Dysphagia, oropharyngeal phase: Secondary | ICD-10-CM | POA: Diagnosis not present

## 2016-11-24 DIAGNOSIS — I872 Venous insufficiency (chronic) (peripheral): Secondary | ICD-10-CM | POA: Diagnosis not present

## 2016-11-24 DIAGNOSIS — L97822 Non-pressure chronic ulcer of other part of left lower leg with fat layer exposed: Secondary | ICD-10-CM | POA: Diagnosis not present

## 2016-11-24 DIAGNOSIS — M6281 Muscle weakness (generalized): Secondary | ICD-10-CM | POA: Diagnosis not present

## 2016-11-24 DIAGNOSIS — L89322 Pressure ulcer of left buttock, stage 2: Secondary | ICD-10-CM | POA: Diagnosis not present

## 2016-11-24 NOTE — Patient Outreach (Signed)
[facility]) Care Management   11/24/2016  Orvan SeenRuth J Page 1925/12/22 [id number]  Kathryn HughsRuth J Page is an 83 y.o. female [place] with her son and daughter in law. I am seeing [name] to assist with care coordination needs around immobility and fall risk, acute/chronic skin condition (lymphedema of bilateral lower extremities), and nutrition concerns.   Subjective: "I don't feel like eating anything and I want to have a better appetite so I can get stronger"  Objective:  There were no vitals taken for this visit.  Review of Systems  Constitutional: Positive for malaise/fatigue. Negative for chills and fever.  HENT: Negative.   Eyes: Negative.   Respiratory: Positive for cough. Negative for sputum production, shortness of breath and wheezing.        Reported continued intermittent cough although improved with help of interventions from speech therapy  Cardiovascular: Negative for chest pain, palpitations, orthopnea and leg swelling.  Gastrointestinal: Negative for constipation, diarrhea, heartburn, nausea and vomiting.  Genitourinary: Positive for frequency and urgency.       Reports urgency and frequency with 2 "accidents" in the last 2 weeks   Musculoskeletal: Positive for myalgias. Negative for falls.  Skin:       Bilateral lower extremities wrapped (HHRN dressing) for treatment of lymphedema; dressings dry and intact  Neurological: Positive for weakness.  Psychiatric/Behavioral: Negative.     Physical Exam  Constitutional: She is oriented to person, place, and time. She appears well-developed and well-nourished. She appears listless. She has a sickly appearance. She does not appear ill.  Cardiovascular: Normal rate and regular rhythm.   Murmur heard. Respiratory: Effort normal and breath sounds normal. She has no wheezes. She has no rhonchi. She has no rales.  GI: Soft. Bowel sounds are normal. She exhibits no distension. There is no  tenderness.  Neurological: She is oriented to person, place, and time. She appears listless.  Skin: Skin is warm and dry.  Psychiatric: Her speech is normal and behavior is normal. Judgment and thought content normal. Cognition and memory are normal.  Appears sleepy today; fell asleep twice during conversation    Encounter Medications:   Outpatient Encounter Prescriptions as of 11/24/2016  Medication Sig Note  . calcium-vitamin D (OSCAL WITH D) 500-200 MG-UNIT per tablet Take 1 tablet by mouth 2 (two) times daily with a meal.   . Misc Natural Products (FLEX-A-MIN JOINT FLEX) TABS Take 1 tablet by mouth daily.   Marland Kitchen. senna-docusate (SENOKOT S) 8.6-50 MG per tablet Take 1 tablet by mouth at bedtime.   . traMADol (ULTRAM) 50 MG tablet Take 0.5 tablets (25 mg total) by mouth every 6 (six) hours as needed for moderate pain. 10/31/2016: Has on hand; takes prn  . Turmeric Curcumin 500 MG CAPS Take 1 capsule by mouth daily as needed (for pain).   Marland Kitchen. enoxaparin (LOVENOX) 40 MG/0.4ML injection Inject 0.4 mLs (40 mg total) into the skin daily. (Patient not taking: Reported on 10/31/2016)   . feeding supplement, ENSURE COMPLETE, (ENSURE COMPLETE) LIQD Take 237 mLs by mouth 3 (three) times daily between meals. (Patient not taking: Reported on 10/31/2016)    Assessment:  Delightful and very cognitively sharp 83 year old lady living in [place] in [place] home where her son and daughter in law reside with her to help provide for her increasing care needs.   Home Safety/Fall Risk- [name] has recently had falls at home related to contractures of the right knee, poor  balance and immobility. I requested referral to home health for HHPT/OT which was ordered by Dr. Margo Aye. [name] is being seen twice weekly by HHPT and by HHOT. She and her son say they believe the therapy is productive and helping Mrs. Tomasello.    Urinary Urgency/Frequency - [name] and her son report that within an hour after taking her  diuretic, [name] has urinary urgency and frequency and often cannot make it to the bathroom in time to avoid an accident. She does not affirm other signs or symptoms of UTI (burning, odor, discomfort). [name] states she would like to ask the doctor if her diuretic dose (  once daily) can be decreased or changed to every other day. I advised that I would discuss this further with Dr. Margo Aye.    Acute Skin Condition- [name] has lymphedema of bilateral lower extremities which is being treated as per Dr. Scharlene Gloss orders by the home health nurse and physical therapist with [facility]. [name]'s legs are notably smaller today. She has dressings to her bilateral lower extremities.   [name] onychomycosis and dermal thickening was treated at home by [name]. [name] is VERY pleased with how much better her feet feel and is grateful to [name] for her care.    Nutrition Concerns- [name] is only eating one meal daily; given her skin/wound healing needs, I would surmise that she has a significant protein calorie deficit. She doesn't like Ensure and tells me today that no matter how hard she tries, she has a very poor appetite and doesn't want to eat more than a few bites of anything. She says she knows this isn't helping her heal and wants to know what would help her appetite. I advised that I would discuss this with Dr. Margo Aye.   Swallowing Concerns- [name] and her son reported occasional difficulty with swallowing and some choking/coughing with eating/drinking. Dr. Margo Aye ordered HHST who have been visiting/treating [name] twice weekly. [name] and her son tell me today that the therapist said she is showing improvement.   Plan: I have reached out to the office of Dr. Margo Aye regarding [name] request of consideration re: lasix dosing and something to help her appetite.   I will follow up with [name] and her son by phone.     Marja Kays MHA,BSN,RN,CCM [facility] Care Management  [phone]

## 2016-11-24 NOTE — Patient Outreach (Signed)
[facility]) Care Management  11/24/2016  [facility]Ruth [facility] [birth year] [id number]   CSW attempted to reach patient to follow-up on initial home visit, but no answer. CSW left HIPPA compliant voicemail on [phone] & attempted to reach patient's son, [name] at [phone] but his voicemail is full. CSW will await call back or will try again in 1 week.    Lincoln MaxinKelly Merrisa Skorupski, LCSW [facility]  Clinical Social Worker cell #: [phone]

## 2016-11-25 DIAGNOSIS — S31809D Unspecified open wound of unspecified buttock, subsequent encounter: Secondary | ICD-10-CM | POA: Diagnosis not present

## 2016-11-25 DIAGNOSIS — R946 Abnormal results of thyroid function studies: Secondary | ICD-10-CM | POA: Diagnosis not present

## 2016-11-25 DIAGNOSIS — R601 Generalized edema: Secondary | ICD-10-CM | POA: Diagnosis not present

## 2016-11-25 DIAGNOSIS — E46 Unspecified protein-calorie malnutrition: Secondary | ICD-10-CM | POA: Diagnosis not present

## 2016-11-25 DIAGNOSIS — I872 Venous insufficiency (chronic) (peripheral): Secondary | ICD-10-CM | POA: Diagnosis not present

## 2016-11-25 DIAGNOSIS — R609 Edema, unspecified: Secondary | ICD-10-CM | POA: Diagnosis not present

## 2016-11-25 DIAGNOSIS — E559 Vitamin D deficiency, unspecified: Secondary | ICD-10-CM | POA: Diagnosis not present

## 2016-11-25 DIAGNOSIS — D649 Anemia, unspecified: Secondary | ICD-10-CM | POA: Diagnosis not present

## 2016-11-25 DIAGNOSIS — D509 Iron deficiency anemia, unspecified: Secondary | ICD-10-CM | POA: Diagnosis not present

## 2016-11-25 DIAGNOSIS — R7301 Impaired fasting glucose: Secondary | ICD-10-CM | POA: Diagnosis not present

## 2016-11-26 ENCOUNTER — Ambulatory Visit: Payer: Self-pay | Admitting: *Deleted

## 2016-11-28 ENCOUNTER — Other Ambulatory Visit: Payer: Self-pay | Admitting: *Deleted

## 2016-11-28 DIAGNOSIS — I872 Venous insufficiency (chronic) (peripheral): Secondary | ICD-10-CM | POA: Diagnosis not present

## 2016-11-28 DIAGNOSIS — L97822 Non-pressure chronic ulcer of other part of left lower leg with fat layer exposed: Secondary | ICD-10-CM | POA: Diagnosis not present

## 2016-11-28 DIAGNOSIS — L89322 Pressure ulcer of left buttock, stage 2: Secondary | ICD-10-CM | POA: Diagnosis not present

## 2016-11-28 DIAGNOSIS — M6281 Muscle weakness (generalized): Secondary | ICD-10-CM | POA: Diagnosis not present

## 2016-11-28 DIAGNOSIS — L97812 Non-pressure chronic ulcer of other part of right lower leg with fat layer exposed: Secondary | ICD-10-CM | POA: Diagnosis not present

## 2016-11-28 DIAGNOSIS — R1312 Dysphagia, oropharyngeal phase: Secondary | ICD-10-CM | POA: Diagnosis not present

## 2016-11-28 NOTE — Patient Outreach (Signed)
[facility]) Care Management  11/28/2016  [name] [facility] 1925/11/15 [id number]  Telephone follow up today with [name] and her son [name] who is primary caregiver:   Assessment:  Delightful and very cognitively sharp 83 year old lady living in [place] in her home where her son and daughter in law reside with her to help provide for her increasing care needs.   Home Safety/Fall Risk- [name] has recently had falls at home related to contractures of the right knee, poor balance and immobility. [name] is being seen twice weekly by HHPT and by HHOT. She and her son say they believe the therapy is productive and helping [name]. She has not had falls since engagement with [facility] and HHPT/OT services began.   Urinary Urgency/Frequency - [name] and her son reported earlier this week that within an hour after taking her diuretic, [name] has urinary urgency and frequency and often cannot make it to the bathroom in time to avoid an accident. She did not affirm other signs or symptoms of UTI (burning, odor, discomfort). I notified Dr. Margo Aye of this new finding and [name] request for consideration of whether her diuretic dose (  once daily) can be decreased or changed to every other day. Today, Mr. Costabile reports that Dr. Margo Aye changed [name]'s diuretic dosing instructions to every other day. Mr. Wien has made the change to [name]'s regimen.   Acute Skin Condition- [name] has lymphedema of bilaterallower extremities which is being treated as per Dr. Scharlene Gloss orders by the home health nurse and physical therapist with [facility].   Today, Mr. Sandoval says that [name]'s left foot is slightly more swollen than usual, just below the edge of her dressing. [name] denied that the foot was uncomfortable and Mr. Haller indicated that the color and temperature were unchanged. I advised that [name] elevate her legs/feet and  notify Dr.Hall's office if the swelling worsens or if she has pain, change in color, or warmth of the foot.   Nutrition Concerns- [name] is only eating one meal daily; given her skin/wound healing needs, I would surmise that she has a significant protein calorie deficit. She doesn't like Ensure and told me earlier in the week that no matter how hard she tries, she has a very poor appetite and doesn't want to eat more than a few bites of anything. Per her request, I inquired with Dr. Margo Aye re: "something that would help her appetite".  Today, Mr. Metzger says nothing was ordered or recommended for [name] poor appetite, to his knowledge. I will discuss this further with Dr. Margo Aye.   Swallowing Concerns- [name] and her son reported occasional difficulty with swallowing and some choking/coughing with eating/drinking. Dr. Margo Aye ordered HHST who have been visiting/treating [name] twice weekly. [name] and her son tell me today that the therapist said she is showing improvement.   Plan:  I will follow up with Dr. Margo Aye regarding [name]'s poor appetite.   I will reach out to [name] again at the end of next week.   [name] will continue to take medications as prescribed, notify Dr. Margo Aye if she has new or worsened symptoms, and will continue to work with the [facility] nurse and therapists.    [name] MHA,BSN,RN,CCM [facility] Care Management  [phone]

## 2016-12-01 ENCOUNTER — Other Ambulatory Visit: Payer: Self-pay | Admitting: *Deleted

## 2016-12-01 NOTE — Patient Outreach (Signed)
[facility]) Care Management  12/01/2016  Shirelle Tootle Mohs Aug 23, 1925 [id number]   CSW made a second attempt to try and reach patient & son, Brayton Caves today to follow-up on resources. CSW left a HIPPA compliant message on patient's home phone (#: [phone]), CSW attempted to reach patient's son, Brayton Caves (ph#: [phone]) but no answer & voicemail box was full.  CSW will make a third outreach attempt within the next week & have CMA mail unsuccessful outreach letter, if CSW does not receive a return call from patient in the meantime.    Lincoln Maxin, LCSW [facility]  Clinical Social Worker cell #: [phone]

## 2016-12-03 DIAGNOSIS — I872 Venous insufficiency (chronic) (peripheral): Secondary | ICD-10-CM | POA: Diagnosis not present

## 2016-12-03 DIAGNOSIS — L97822 Non-pressure chronic ulcer of other part of left lower leg with fat layer exposed: Secondary | ICD-10-CM | POA: Diagnosis not present

## 2016-12-03 DIAGNOSIS — L89322 Pressure ulcer of left buttock, stage 2: Secondary | ICD-10-CM | POA: Diagnosis not present

## 2016-12-03 DIAGNOSIS — L97812 Non-pressure chronic ulcer of other part of right lower leg with fat layer exposed: Secondary | ICD-10-CM | POA: Diagnosis not present

## 2016-12-03 DIAGNOSIS — M6281 Muscle weakness (generalized): Secondary | ICD-10-CM | POA: Diagnosis not present

## 2016-12-03 DIAGNOSIS — R1312 Dysphagia, oropharyngeal phase: Secondary | ICD-10-CM | POA: Diagnosis not present

## 2016-12-04 DIAGNOSIS — I872 Venous insufficiency (chronic) (peripheral): Secondary | ICD-10-CM | POA: Diagnosis not present

## 2016-12-04 DIAGNOSIS — M6281 Muscle weakness (generalized): Secondary | ICD-10-CM | POA: Diagnosis not present

## 2016-12-04 DIAGNOSIS — L97822 Non-pressure chronic ulcer of other part of left lower leg with fat layer exposed: Secondary | ICD-10-CM | POA: Diagnosis not present

## 2016-12-04 DIAGNOSIS — L97812 Non-pressure chronic ulcer of other part of right lower leg with fat layer exposed: Secondary | ICD-10-CM | POA: Diagnosis not present

## 2016-12-04 DIAGNOSIS — L89322 Pressure ulcer of left buttock, stage 2: Secondary | ICD-10-CM | POA: Diagnosis not present

## 2016-12-04 DIAGNOSIS — R1312 Dysphagia, oropharyngeal phase: Secondary | ICD-10-CM | POA: Diagnosis not present

## 2016-12-05 ENCOUNTER — Other Ambulatory Visit: Payer: Self-pay | Admitting: *Deleted

## 2016-12-05 DIAGNOSIS — L97812 Non-pressure chronic ulcer of other part of right lower leg with fat layer exposed: Secondary | ICD-10-CM | POA: Diagnosis not present

## 2016-12-05 DIAGNOSIS — M6281 Muscle weakness (generalized): Secondary | ICD-10-CM | POA: Diagnosis not present

## 2016-12-05 DIAGNOSIS — I872 Venous insufficiency (chronic) (peripheral): Secondary | ICD-10-CM | POA: Diagnosis not present

## 2016-12-05 DIAGNOSIS — L89322 Pressure ulcer of left buttock, stage 2: Secondary | ICD-10-CM | POA: Diagnosis not present

## 2016-12-05 DIAGNOSIS — L97822 Non-pressure chronic ulcer of other part of left lower leg with fat layer exposed: Secondary | ICD-10-CM | POA: Diagnosis not present

## 2016-12-05 DIAGNOSIS — R1312 Dysphagia, oropharyngeal phase: Secondary | ICD-10-CM | POA: Diagnosis not present

## 2016-12-05 NOTE — Patient Outreach (Signed)
[facility]) Care Management  12/05/2016  Stellarose Cerny Palen 10-27-1925 [id number]  I was unable to reach Mrs. Welsch when I called her today to follow up on her progress and home health care. I left a HIPPA compliant message requesting a return call.   Plan: I will reach out to Mrs. Kuk next week by phone.    Marja Kays MHA,BSN,RN,CCM [facility] Care Management  [phone]

## 2016-12-08 ENCOUNTER — Ambulatory Visit: Payer: Medicare Other | Admitting: *Deleted

## 2016-12-08 ENCOUNTER — Other Ambulatory Visit: Payer: Self-pay | Admitting: *Deleted

## 2016-12-08 NOTE — Patient Outreach (Signed)
[facility]) Care Management  12/08/2016  ANIELA CANIGLIA [birth year] [id number]   CSW called & spoke with patient's son, Brayton Caves (ph#: [phone]) to follow-up on resources. Patient's son states that he's been working a lot lately, but is off this whole week and plans to call Etheleen Nicks with [employer] to complete a CAP application.   CSW also spoke with patient's son about advance directives. Son states that his mom already has a living will and healthcare power of attorney but is not sure where it is but plans to look for it as well & will call CSW to have it scanned into Epic system.   CSW will follow-up in 3 weeks.    Lincoln Maxin, LCSW [facility]  Clinical Social Worker cell #: [phone]

## 2016-12-09 DIAGNOSIS — L97812 Non-pressure chronic ulcer of other part of right lower leg with fat layer exposed: Secondary | ICD-10-CM | POA: Diagnosis not present

## 2016-12-09 DIAGNOSIS — L89322 Pressure ulcer of left buttock, stage 2: Secondary | ICD-10-CM | POA: Diagnosis not present

## 2016-12-09 DIAGNOSIS — M6281 Muscle weakness (generalized): Secondary | ICD-10-CM | POA: Diagnosis not present

## 2016-12-09 DIAGNOSIS — I872 Venous insufficiency (chronic) (peripheral): Secondary | ICD-10-CM | POA: Diagnosis not present

## 2016-12-09 DIAGNOSIS — R1312 Dysphagia, oropharyngeal phase: Secondary | ICD-10-CM | POA: Diagnosis not present

## 2016-12-09 DIAGNOSIS — L97822 Non-pressure chronic ulcer of other part of left lower leg with fat layer exposed: Secondary | ICD-10-CM | POA: Diagnosis not present

## 2016-12-10 DIAGNOSIS — L97812 Non-pressure chronic ulcer of other part of right lower leg with fat layer exposed: Secondary | ICD-10-CM | POA: Diagnosis not present

## 2016-12-10 DIAGNOSIS — M6281 Muscle weakness (generalized): Secondary | ICD-10-CM | POA: Diagnosis not present

## 2016-12-10 DIAGNOSIS — I872 Venous insufficiency (chronic) (peripheral): Secondary | ICD-10-CM | POA: Diagnosis not present

## 2016-12-10 DIAGNOSIS — L89322 Pressure ulcer of left buttock, stage 2: Secondary | ICD-10-CM | POA: Diagnosis not present

## 2016-12-10 DIAGNOSIS — R1312 Dysphagia, oropharyngeal phase: Secondary | ICD-10-CM | POA: Diagnosis not present

## 2016-12-10 DIAGNOSIS — L97822 Non-pressure chronic ulcer of other part of left lower leg with fat layer exposed: Secondary | ICD-10-CM | POA: Diagnosis not present

## 2016-12-11 ENCOUNTER — Other Ambulatory Visit: Payer: Self-pay | Admitting: *Deleted

## 2016-12-11 NOTE — Patient Outreach (Signed)
[facility]) Care Management  12/11/2016  Lannah Koike Smigiel 12-14-1925 [id number]  Unable to reach Mrs. Finerty or her son by phone today. I left a HIPPA compliant message requesting a return call.   Plan: I will reach out to Mrs. Edell again within 7 days to follow up on her care coordination and disease management needs.    Marja Kays MHA,BSN,RN,CCM [facility] Care Management  [phone]

## 2016-12-12 DIAGNOSIS — M6281 Muscle weakness (generalized): Secondary | ICD-10-CM | POA: Diagnosis not present

## 2016-12-12 DIAGNOSIS — I872 Venous insufficiency (chronic) (peripheral): Secondary | ICD-10-CM | POA: Diagnosis not present

## 2016-12-12 DIAGNOSIS — L97812 Non-pressure chronic ulcer of other part of right lower leg with fat layer exposed: Secondary | ICD-10-CM | POA: Diagnosis not present

## 2016-12-12 DIAGNOSIS — L89322 Pressure ulcer of left buttock, stage 2: Secondary | ICD-10-CM | POA: Diagnosis not present

## 2016-12-12 DIAGNOSIS — L97822 Non-pressure chronic ulcer of other part of left lower leg with fat layer exposed: Secondary | ICD-10-CM | POA: Diagnosis not present

## 2016-12-12 DIAGNOSIS — R1312 Dysphagia, oropharyngeal phase: Secondary | ICD-10-CM | POA: Diagnosis not present

## 2016-12-15 ENCOUNTER — Other Ambulatory Visit: Payer: Self-pay | Admitting: *Deleted

## 2016-12-15 NOTE — Patient Outreach (Signed)
[facility]) Care Management  12/15/2016  [name] [birth year] [id number]  Unable to reach [name] or her son by phone when I called today to follow up on her progress with nutrition and skin conditions. [name] is unable to answer the phone easily on days her son/primary caregiver is working. He checks messages upon his return from work. I left a message indicating my availability to address care coordination or nursing needs and advised that I would call again next week, leaving my contact information should either of them need to reach me.   [name] continues to received home health nurse, PT, and OT visits.   Plan: I will reach out to [name] next week by phone.    Marja Kays MHA,BSN,RN,CCM [facility] Care Management  [phone]

## 2016-12-17 DIAGNOSIS — M6281 Muscle weakness (generalized): Secondary | ICD-10-CM | POA: Diagnosis not present

## 2016-12-17 DIAGNOSIS — L89322 Pressure ulcer of left buttock, stage 2: Secondary | ICD-10-CM | POA: Diagnosis not present

## 2016-12-17 DIAGNOSIS — R1312 Dysphagia, oropharyngeal phase: Secondary | ICD-10-CM | POA: Diagnosis not present

## 2016-12-17 DIAGNOSIS — L97822 Non-pressure chronic ulcer of other part of left lower leg with fat layer exposed: Secondary | ICD-10-CM | POA: Diagnosis not present

## 2016-12-17 DIAGNOSIS — L97812 Non-pressure chronic ulcer of other part of right lower leg with fat layer exposed: Secondary | ICD-10-CM | POA: Diagnosis not present

## 2016-12-17 DIAGNOSIS — I872 Venous insufficiency (chronic) (peripheral): Secondary | ICD-10-CM | POA: Diagnosis not present

## 2016-12-19 ENCOUNTER — Other Ambulatory Visit: Payer: Self-pay | Admitting: *Deleted

## 2016-12-22 ENCOUNTER — Ambulatory Visit: Payer: Self-pay | Admitting: *Deleted

## 2016-12-24 ENCOUNTER — Other Ambulatory Visit: Payer: Self-pay | Admitting: *Deleted

## 2016-12-24 NOTE — Patient Outreach (Signed)
[facility]) Care Management  12/24/2016  [name] [birth year] [id number]  Unable to reach [name] or [name] by phone today. I left a HIPPA compliant voice message requesting a return call.   Plan: I will follow up with [name] by phone and will request to schedule a home visit to determine [name]'s progress and whether she wishes to continue CM visits.    Marja Kays MHA,BSN,RN,CCM [facility] Care Management  [phone]

## 2016-12-25 ENCOUNTER — Ambulatory Visit: Payer: Self-pay | Admitting: *Deleted

## 2016-12-26 DIAGNOSIS — L97822 Non-pressure chronic ulcer of other part of left lower leg with fat layer exposed: Secondary | ICD-10-CM | POA: Diagnosis not present

## 2016-12-26 DIAGNOSIS — R1312 Dysphagia, oropharyngeal phase: Secondary | ICD-10-CM | POA: Diagnosis not present

## 2016-12-26 DIAGNOSIS — M6281 Muscle weakness (generalized): Secondary | ICD-10-CM | POA: Diagnosis not present

## 2016-12-26 DIAGNOSIS — L89322 Pressure ulcer of left buttock, stage 2: Secondary | ICD-10-CM | POA: Diagnosis not present

## 2016-12-26 DIAGNOSIS — I872 Venous insufficiency (chronic) (peripheral): Secondary | ICD-10-CM | POA: Diagnosis not present

## 2016-12-26 DIAGNOSIS — L97812 Non-pressure chronic ulcer of other part of right lower leg with fat layer exposed: Secondary | ICD-10-CM | POA: Diagnosis not present

## 2016-12-29 ENCOUNTER — Other Ambulatory Visit: Payer: Self-pay | Admitting: *Deleted

## 2016-12-29 NOTE — Patient Outreach (Signed)
[facility]) Care Management  12/29/2016  [name] Oct 11, 1925 [id number]   CSW attempted to reach patient and/or son, [name] to follow-up on resources, but no answer. CSW left HIPPA compliant voicemail at ph#: [phone]. CSW will await call back or try again next week.    Lincoln Maxin, LCSW [facility]  Clinical Social Worker cell #: [phone]

## 2016-12-30 ENCOUNTER — Ambulatory Visit: Payer: Self-pay | Admitting: *Deleted

## 2016-12-31 ENCOUNTER — Other Ambulatory Visit: Payer: Self-pay | Admitting: *Deleted

## 2017-01-01 ENCOUNTER — Ambulatory Visit: Payer: Self-pay | Admitting: *Deleted

## 2017-01-01 DIAGNOSIS — L89322 Pressure ulcer of left buttock, stage 2: Secondary | ICD-10-CM | POA: Diagnosis not present

## 2017-01-01 DIAGNOSIS — R1312 Dysphagia, oropharyngeal phase: Secondary | ICD-10-CM | POA: Diagnosis not present

## 2017-01-01 DIAGNOSIS — M6281 Muscle weakness (generalized): Secondary | ICD-10-CM | POA: Diagnosis not present

## 2017-01-01 DIAGNOSIS — L97812 Non-pressure chronic ulcer of other part of right lower leg with fat layer exposed: Secondary | ICD-10-CM | POA: Diagnosis not present

## 2017-01-01 DIAGNOSIS — I872 Venous insufficiency (chronic) (peripheral): Secondary | ICD-10-CM | POA: Diagnosis not present

## 2017-01-01 DIAGNOSIS — L97822 Non-pressure chronic ulcer of other part of left lower leg with fat layer exposed: Secondary | ICD-10-CM | POA: Diagnosis not present

## 2017-01-05 ENCOUNTER — Other Ambulatory Visit: Payer: Self-pay | Admitting: *Deleted

## 2017-01-05 NOTE — Patient Outreach (Signed)
[facility]) Care Management  01/05/2017  [name] 12-Oct-1925 [id number]   CSW made second attempt to reach patient and/or son, [name] to follow-up on resources, but no answer. CSW left HIPPA compliant voicemail at ph#: [phone]. CSW will have CMA mail out unsuccessful outreach letter & try again within a week for third attempt.    Lincoln MaxinKelly Branden Shallenberger, LCSW [facility]  Clinical Social Worker cell #: [phone]

## 2017-01-11 ENCOUNTER — Encounter (HOSPITAL_COMMUNITY): Payer: Self-pay | Admitting: *Deleted

## 2017-01-11 ENCOUNTER — Emergency Department (HOSPITAL_COMMUNITY): Payer: Medicare Other

## 2017-01-11 ENCOUNTER — Inpatient Hospital Stay (HOSPITAL_COMMUNITY)
Admission: EM | Admit: 2017-01-11 | Discharge: 2017-02-14 | DRG: 064 | Disposition: E | Payer: Medicare Other | Attending: Internal Medicine | Admitting: Internal Medicine

## 2017-01-11 DIAGNOSIS — E1151 Type 2 diabetes mellitus with diabetic peripheral angiopathy without gangrene: Secondary | ICD-10-CM | POA: Diagnosis present

## 2017-01-11 DIAGNOSIS — R402214 Coma scale, best verbal response, none, 24 hours or more after hospital admission: Secondary | ICD-10-CM | POA: Diagnosis present

## 2017-01-11 DIAGNOSIS — L89323 Pressure ulcer of left buttock, stage 3: Secondary | ICD-10-CM | POA: Diagnosis not present

## 2017-01-11 DIAGNOSIS — I639 Cerebral infarction, unspecified: Principal | ICD-10-CM | POA: Diagnosis present

## 2017-01-11 DIAGNOSIS — R9431 Abnormal electrocardiogram [ECG] [EKG]: Secondary | ICD-10-CM | POA: Diagnosis not present

## 2017-01-11 DIAGNOSIS — R6521 Severe sepsis with septic shock: Secondary | ICD-10-CM | POA: Diagnosis not present

## 2017-01-11 DIAGNOSIS — Z7901 Long term (current) use of anticoagulants: Secondary | ICD-10-CM

## 2017-01-11 DIAGNOSIS — Z880 Allergy status to penicillin: Secondary | ICD-10-CM

## 2017-01-11 DIAGNOSIS — J189 Pneumonia, unspecified organism: Secondary | ICD-10-CM | POA: Diagnosis present

## 2017-01-11 DIAGNOSIS — G9341 Metabolic encephalopathy: Secondary | ICD-10-CM | POA: Diagnosis present

## 2017-01-11 DIAGNOSIS — R823 Hemoglobinuria: Secondary | ICD-10-CM | POA: Diagnosis present

## 2017-01-11 DIAGNOSIS — Z993 Dependence on wheelchair: Secondary | ICD-10-CM

## 2017-01-11 DIAGNOSIS — L89609 Pressure ulcer of unspecified heel, unspecified stage: Secondary | ICD-10-CM | POA: Diagnosis present

## 2017-01-11 DIAGNOSIS — Z87891 Personal history of nicotine dependence: Secondary | ICD-10-CM

## 2017-01-11 DIAGNOSIS — B964 Proteus (mirabilis) (morganii) as the cause of diseases classified elsewhere: Secondary | ICD-10-CM | POA: Diagnosis present

## 2017-01-11 DIAGNOSIS — L89309 Pressure ulcer of unspecified buttock, unspecified stage: Secondary | ICD-10-CM | POA: Diagnosis present

## 2017-01-11 DIAGNOSIS — J9601 Acute respiratory failure with hypoxia: Secondary | ICD-10-CM | POA: Diagnosis not present

## 2017-01-11 DIAGNOSIS — Z66 Do not resuscitate: Secondary | ICD-10-CM | POA: Diagnosis not present

## 2017-01-11 DIAGNOSIS — Z7189 Other specified counseling: Secondary | ICD-10-CM

## 2017-01-11 DIAGNOSIS — A419 Sepsis, unspecified organism: Secondary | ICD-10-CM | POA: Diagnosis not present

## 2017-01-11 DIAGNOSIS — Z515 Encounter for palliative care: Secondary | ICD-10-CM

## 2017-01-11 DIAGNOSIS — R471 Dysarthria and anarthria: Secondary | ICD-10-CM | POA: Diagnosis not present

## 2017-01-11 DIAGNOSIS — R809 Proteinuria, unspecified: Secondary | ICD-10-CM | POA: Diagnosis present

## 2017-01-11 DIAGNOSIS — Z885 Allergy status to narcotic agent status: Secondary | ICD-10-CM

## 2017-01-11 DIAGNOSIS — Z8249 Family history of ischemic heart disease and other diseases of the circulatory system: Secondary | ICD-10-CM

## 2017-01-11 DIAGNOSIS — R600 Localized edema: Secondary | ICD-10-CM | POA: Diagnosis present

## 2017-01-11 DIAGNOSIS — Z96642 Presence of left artificial hip joint: Secondary | ICD-10-CM | POA: Diagnosis present

## 2017-01-11 DIAGNOSIS — T68XXXA Hypothermia, initial encounter: Secondary | ICD-10-CM | POA: Diagnosis not present

## 2017-01-11 DIAGNOSIS — R29728 NIHSS score 28: Secondary | ICD-10-CM | POA: Diagnosis present

## 2017-01-11 DIAGNOSIS — R402134 Coma scale, eyes open, to sound, 24 hours or more after hospital admission: Secondary | ICD-10-CM | POA: Diagnosis present

## 2017-01-11 DIAGNOSIS — R4182 Altered mental status, unspecified: Secondary | ICD-10-CM | POA: Diagnosis present

## 2017-01-11 DIAGNOSIS — G92 Toxic encephalopathy: Secondary | ICD-10-CM | POA: Diagnosis present

## 2017-01-11 DIAGNOSIS — Z1639 Resistance to other specified antimicrobial drug: Secondary | ICD-10-CM | POA: Diagnosis present

## 2017-01-11 DIAGNOSIS — E876 Hypokalemia: Secondary | ICD-10-CM | POA: Diagnosis not present

## 2017-01-11 DIAGNOSIS — Z823 Family history of stroke: Secondary | ICD-10-CM

## 2017-01-11 DIAGNOSIS — E11649 Type 2 diabetes mellitus with hypoglycemia without coma: Secondary | ICD-10-CM | POA: Diagnosis not present

## 2017-01-11 DIAGNOSIS — N390 Urinary tract infection, site not specified: Secondary | ICD-10-CM | POA: Diagnosis present

## 2017-01-11 DIAGNOSIS — R402344 Coma scale, best motor response, flexion withdrawal, 24 hours or more after hospital admission: Secondary | ICD-10-CM | POA: Diagnosis present

## 2017-01-11 DIAGNOSIS — L89310 Pressure ulcer of right buttock, unstageable: Secondary | ICD-10-CM | POA: Diagnosis present

## 2017-01-11 DIAGNOSIS — L89890 Pressure ulcer of other site, unstageable: Secondary | ICD-10-CM | POA: Diagnosis present

## 2017-01-11 DIAGNOSIS — J811 Chronic pulmonary edema: Secondary | ICD-10-CM | POA: Diagnosis not present

## 2017-01-11 DIAGNOSIS — E119 Type 2 diabetes mellitus without complications: Secondary | ICD-10-CM

## 2017-01-11 HISTORY — DX: Type 2 diabetes mellitus without complications: E11.9

## 2017-01-11 LAB — CBC WITH DIFFERENTIAL/PLATELET
Basophils Absolute: 0 10*3/uL (ref 0.0–0.1)
Basophils Relative: 0 %
Eosinophils Absolute: 0 10*3/uL (ref 0.0–0.7)
Eosinophils Relative: 0 %
HCT: 36.7 % (ref 36.0–46.0)
Hemoglobin: 11.9 g/dL — ABNORMAL LOW (ref 12.0–15.0)
Lymphocytes Relative: 4 %
Lymphs Abs: 0.3 10*3/uL — ABNORMAL LOW (ref 0.7–4.0)
MCH: 30.6 pg (ref 26.0–34.0)
MCHC: 32.4 g/dL (ref 30.0–36.0)
MCV: 94.3 fL (ref 78.0–100.0)
Monocytes Absolute: 0.5 10*3/uL (ref 0.1–1.0)
Monocytes Relative: 5 %
Neutro Abs: 8.4 10*3/uL — ABNORMAL HIGH (ref 1.7–7.7)
Neutrophils Relative %: 91 %
Platelets: 181 10*3/uL (ref 150–400)
RBC: 3.89 MIL/uL (ref 3.87–5.11)
RDW: 14 % (ref 11.5–15.5)
WBC: 9.3 10*3/uL (ref 4.0–10.5)

## 2017-01-11 LAB — BASIC METABOLIC PANEL
Anion gap: 8 (ref 5–15)
BUN: 9 mg/dL (ref 6–20)
CO2: 28 mmol/L (ref 22–32)
Calcium: 9.6 mg/dL (ref 8.9–10.3)
Chloride: 103 mmol/L (ref 101–111)
Creatinine, Ser: 0.42 mg/dL — ABNORMAL LOW (ref 0.44–1.00)
GFR calc Af Amer: >60 mL/min (ref >60)
GFR calc non Af Amer: >60 mL/min (ref >60)
Glucose, Bld: 118 mg/dL — ABNORMAL HIGH (ref 65–99)
Potassium: 4 mmol/L (ref 3.5–5.1)
Sodium: 139 mmol/L (ref 135–145)

## 2017-01-11 NOTE — ED Triage Notes (Signed)
Pt brought in by her son who states pt has had some garbled speech x 2 days; pt is alert and only able to state her name

## 2017-01-12 ENCOUNTER — Inpatient Hospital Stay (HOSPITAL_COMMUNITY): Payer: Medicare Other

## 2017-01-12 ENCOUNTER — Other Ambulatory Visit: Payer: Self-pay | Admitting: *Deleted

## 2017-01-12 ENCOUNTER — Encounter (HOSPITAL_COMMUNITY): Payer: Self-pay | Admitting: Internal Medicine

## 2017-01-12 DIAGNOSIS — E1151 Type 2 diabetes mellitus with diabetic peripheral angiopathy without gangrene: Secondary | ICD-10-CM | POA: Diagnosis present

## 2017-01-12 DIAGNOSIS — Z66 Do not resuscitate: Secondary | ICD-10-CM | POA: Diagnosis not present

## 2017-01-12 DIAGNOSIS — L89300 Pressure ulcer of unspecified buttock, unstageable: Secondary | ICD-10-CM | POA: Diagnosis not present

## 2017-01-12 DIAGNOSIS — N390 Urinary tract infection, site not specified: Secondary | ICD-10-CM

## 2017-01-12 DIAGNOSIS — Z7189 Other specified counseling: Secondary | ICD-10-CM | POA: Diagnosis not present

## 2017-01-12 DIAGNOSIS — I6340 Cerebral infarction due to embolism of unspecified cerebral artery: Secondary | ICD-10-CM | POA: Diagnosis not present

## 2017-01-12 DIAGNOSIS — R29728 NIHSS score 28: Secondary | ICD-10-CM | POA: Diagnosis present

## 2017-01-12 DIAGNOSIS — L89310 Pressure ulcer of right buttock, unstageable: Secondary | ICD-10-CM | POA: Diagnosis present

## 2017-01-12 DIAGNOSIS — Z1639 Resistance to other specified antimicrobial drug: Secondary | ICD-10-CM | POA: Diagnosis present

## 2017-01-12 DIAGNOSIS — J189 Pneumonia, unspecified organism: Secondary | ICD-10-CM | POA: Diagnosis present

## 2017-01-12 DIAGNOSIS — L89309 Pressure ulcer of unspecified buttock, unspecified stage: Secondary | ICD-10-CM | POA: Diagnosis present

## 2017-01-12 DIAGNOSIS — B964 Proteus (mirabilis) (morganii) as the cause of diseases classified elsewhere: Secondary | ICD-10-CM | POA: Diagnosis present

## 2017-01-12 DIAGNOSIS — R402344 Coma scale, best motor response, flexion withdrawal, 24 hours or more after hospital admission: Secondary | ICD-10-CM | POA: Diagnosis present

## 2017-01-12 DIAGNOSIS — A419 Sepsis, unspecified organism: Secondary | ICD-10-CM | POA: Diagnosis not present

## 2017-01-12 DIAGNOSIS — I6302 Cerebral infarction due to thrombosis of basilar artery: Secondary | ICD-10-CM | POA: Diagnosis not present

## 2017-01-12 DIAGNOSIS — R809 Proteinuria, unspecified: Secondary | ICD-10-CM | POA: Diagnosis present

## 2017-01-12 DIAGNOSIS — R400 Somnolence: Secondary | ICD-10-CM | POA: Diagnosis not present

## 2017-01-12 DIAGNOSIS — I6789 Other cerebrovascular disease: Secondary | ICD-10-CM | POA: Diagnosis not present

## 2017-01-12 DIAGNOSIS — I639 Cerebral infarction, unspecified: Secondary | ICD-10-CM | POA: Diagnosis not present

## 2017-01-12 DIAGNOSIS — R823 Hemoglobinuria: Secondary | ICD-10-CM | POA: Diagnosis present

## 2017-01-12 DIAGNOSIS — T68XXXA Hypothermia, initial encounter: Secondary | ICD-10-CM | POA: Diagnosis not present

## 2017-01-12 DIAGNOSIS — G92 Toxic encephalopathy: Secondary | ICD-10-CM | POA: Diagnosis present

## 2017-01-12 DIAGNOSIS — J9601 Acute respiratory failure with hypoxia: Secondary | ICD-10-CM | POA: Diagnosis not present

## 2017-01-12 DIAGNOSIS — J811 Chronic pulmonary edema: Secondary | ICD-10-CM | POA: Diagnosis not present

## 2017-01-12 DIAGNOSIS — R079 Chest pain, unspecified: Secondary | ICD-10-CM | POA: Diagnosis not present

## 2017-01-12 DIAGNOSIS — T68XXXD Hypothermia, subsequent encounter: Secondary | ICD-10-CM | POA: Diagnosis not present

## 2017-01-12 DIAGNOSIS — Z515 Encounter for palliative care: Secondary | ICD-10-CM | POA: Diagnosis not present

## 2017-01-12 DIAGNOSIS — E876 Hypokalemia: Secondary | ICD-10-CM | POA: Diagnosis not present

## 2017-01-12 DIAGNOSIS — R600 Localized edema: Secondary | ICD-10-CM | POA: Diagnosis present

## 2017-01-12 DIAGNOSIS — I63233 Cerebral infarction due to unspecified occlusion or stenosis of bilateral carotid arteries: Secondary | ICD-10-CM | POA: Diagnosis not present

## 2017-01-12 DIAGNOSIS — R471 Dysarthria and anarthria: Secondary | ICD-10-CM | POA: Diagnosis present

## 2017-01-12 DIAGNOSIS — R413 Other amnesia: Secondary | ICD-10-CM | POA: Diagnosis not present

## 2017-01-12 DIAGNOSIS — G9341 Metabolic encephalopathy: Secondary | ICD-10-CM | POA: Diagnosis present

## 2017-01-12 DIAGNOSIS — L89323 Pressure ulcer of left buttock, stage 3: Secondary | ICD-10-CM | POA: Diagnosis present

## 2017-01-12 DIAGNOSIS — R4182 Altered mental status, unspecified: Secondary | ICD-10-CM | POA: Diagnosis present

## 2017-01-12 DIAGNOSIS — R6521 Severe sepsis with septic shock: Secondary | ICD-10-CM | POA: Diagnosis not present

## 2017-01-12 DIAGNOSIS — R402214 Coma scale, best verbal response, none, 24 hours or more after hospital admission: Secondary | ICD-10-CM | POA: Diagnosis present

## 2017-01-12 DIAGNOSIS — E11649 Type 2 diabetes mellitus with hypoglycemia without coma: Secondary | ICD-10-CM | POA: Diagnosis not present

## 2017-01-12 LAB — VITAMIN B12: VITAMIN B 12: 1240 pg/mL — AB (ref 180–914)

## 2017-01-12 LAB — COMPREHENSIVE METABOLIC PANEL
ALT: 25 U/L (ref 14–54)
ALT: 34 U/L (ref 14–54)
AST: 35 U/L (ref 15–41)
AST: 48 U/L — AB (ref 15–41)
Albumin: 2.8 g/dL — ABNORMAL LOW (ref 3.5–5.0)
Albumin: 2.7 g/dL — ABNORMAL LOW (ref 3.5–5.0)
Alkaline Phosphatase: 78 U/L (ref 38–126)
Alkaline Phosphatase: 84 U/L (ref 38–126)
Anion gap: 5 (ref 5–15)
Anion gap: 7 (ref 5–15)
BUN: 8 mg/dL (ref 6–20)
BUN: 7 mg/dL (ref 6–20)
CHLORIDE: 103 mmol/L (ref 101–111)
CHLORIDE: 105 mmol/L (ref 101–111)
CO2: 29 mmol/L (ref 22–32)
CO2: 25 mmol/L (ref 22–32)
CREATININE: 0.36 mg/dL — AB (ref 0.44–1.00)
Calcium: 8.9 mg/dL (ref 8.9–10.3)
Calcium: 8.4 mg/dL — ABNORMAL LOW (ref 8.9–10.3)
Creatinine, Ser: 0.36 mg/dL — ABNORMAL LOW (ref 0.44–1.00)
GFR calc Af Amer: >60 mL/min (ref >60)
GFR calc non Af Amer: >60 mL/min (ref >60)
Glucose, Bld: 106 mg/dL — ABNORMAL HIGH (ref 65–99)
Glucose, Bld: 125 mg/dL — ABNORMAL HIGH (ref 65–99)
POTASSIUM: 3.7 mmol/L (ref 3.5–5.1)
Potassium: 3.6 mmol/L (ref 3.5–5.1)
SODIUM: 137 mmol/L (ref 135–145)
Sodium: 137 mmol/L (ref 135–145)
Total Bilirubin: 0.5 mg/dL (ref 0.3–1.2)
Total Bilirubin: 0.6 mg/dL (ref 0.3–1.2)
Total Protein: 6.4 g/dL — ABNORMAL LOW (ref 6.5–8.1)
Total Protein: 6 g/dL — ABNORMAL LOW (ref 6.5–8.1)

## 2017-01-12 LAB — URINALYSIS, ROUTINE W REFLEX MICROSCOPIC
Bilirubin Urine: NEGATIVE
Glucose, UA: NEGATIVE mg/dL
Ketones, ur: NEGATIVE mg/dL
Nitrite: POSITIVE — AB
Protein, ur: 100 mg/dL — AB
Specific Gravity, Urine: 1.02 (ref 1.005–1.030)
pH: 7 (ref 5.0–8.0)

## 2017-01-12 LAB — GLUCOSE, CAPILLARY
GLUCOSE-CAPILLARY: 108 mg/dL — AB (ref 65–99)
GLUCOSE-CAPILLARY: 107 mg/dL — AB (ref 65–99)
GLUCOSE-CAPILLARY: 101 mg/dL — AB (ref 65–99)
Glucose-Capillary: 132 mg/dL — ABNORMAL HIGH (ref 65–99)
Glucose-Capillary: 112 mg/dL — ABNORMAL HIGH (ref 65–99)

## 2017-01-12 LAB — PROTIME-INR
INR: 1.3
Prothrombin Time: 16.1 seconds — ABNORMAL HIGH (ref 11.4–15.2)

## 2017-01-12 LAB — CBC
HCT: 34.9 % — ABNORMAL LOW (ref 36.0–46.0)
Hemoglobin: 11 g/dL — ABNORMAL LOW (ref 12.0–15.0)
MCH: 30 pg (ref 26.0–34.0)
MCHC: 31.5 g/dL (ref 30.0–36.0)
MCV: 95.1 fL (ref 78.0–100.0)
PLATELETS: 160 10*3/uL (ref 150–400)
RBC: 3.67 MIL/uL — AB (ref 3.87–5.11)
RDW: 14 % (ref 11.5–15.5)
WBC: 7 10*3/uL (ref 4.0–10.5)

## 2017-01-12 LAB — IRON AND TIBC
Iron: 32 ug/dL (ref 28–170)
SATURATION RATIOS: 13 % (ref 10.4–31.8)
TIBC: 252 ug/dL (ref 250–450)
UIBC: 220 ug/dL

## 2017-01-12 LAB — LACTIC ACID, PLASMA
LACTIC ACID, VENOUS: 0.9 mmol/L (ref 0.5–1.9)
Lactic Acid, Venous: 0.9 mmol/L (ref 0.5–1.9)

## 2017-01-12 LAB — LIPID PANEL
CHOLESTEROL: 153 mg/dL (ref 0–200)
HDL: 48 mg/dL (ref >40)
LDL Cholesterol: 95 mg/dL (ref 0–99)
TRIGLYCERIDES: 49 mg/dL (ref <150)
Total CHOL/HDL Ratio: 3.2 RATIO
VLDL: 10 mg/dL (ref 0–40)

## 2017-01-12 LAB — TYPE AND SCREEN
ABO/RH(D): O POS
ANTIBODY SCREEN: NEGATIVE

## 2017-01-12 LAB — FOLATE: FOLATE: 4.8 ng/mL — AB (ref >5.9)

## 2017-01-12 LAB — PROCALCITONIN

## 2017-01-12 LAB — RETICULOCYTES
RBC.: 3.52 MIL/uL — AB (ref 3.87–5.11)
Retic Count, Absolute: 56.3 10*3/uL (ref 19.0–186.0)
Retic Ct Pct: 1.6 % (ref 0.4–3.1)

## 2017-01-12 LAB — MRSA PCR SCREENING: MRSA by PCR: NEGATIVE

## 2017-01-12 LAB — ECHOCARDIOGRAM COMPLETE
HEIGHTINCHES: 64 in
Weight: 2305.6 oz

## 2017-01-12 LAB — CORTISOL: Cortisol, Plasma: 15.6 ug/dL

## 2017-01-12 LAB — TSH: TSH: 9.641 u[IU]/mL — ABNORMAL HIGH (ref 0.350–4.500)

## 2017-01-12 LAB — FERRITIN: Ferritin: 307 ng/mL (ref 11–307)

## 2017-01-12 LAB — TROPONIN I: Troponin I: <0.03 ng/mL (ref <0.03)

## 2017-01-12 LAB — APTT: APTT: 65 s — AB (ref 24–36)

## 2017-01-12 MED ORDER — DEXTROSE 5 % IV SOLN
2.0000 g | Freq: Once | INTRAVENOUS | Status: DC
  Filled 2017-01-12: qty 2

## 2017-01-12 MED ORDER — STROKE: EARLY STAGES OF RECOVERY BOOK
Freq: Once | Status: DC
  Filled 2017-01-12: qty 1

## 2017-01-12 MED ORDER — DEXTROSE 5 % IV SOLN
0.0000 ug/min | INTRAVENOUS | Status: DC
  Filled 2017-01-12: qty 4

## 2017-01-12 MED ORDER — HYDROCERIN EX CREA
TOPICAL_CREAM | Freq: Two times a day (BID) | CUTANEOUS | Status: DC
  Administered 2017-01-12: 11:00:00 via TOPICAL
  Administered 2017-01-13: 1 via TOPICAL
  Administered 2017-01-13 – 2017-01-15 (×5): via TOPICAL
  Administered 2017-01-16: 1 via TOPICAL
  Administered 2017-01-16: 10:00:00 via TOPICAL
  Administered 2017-01-17: 1 via TOPICAL
  Administered 2017-01-17 – 2017-01-19 (×4): via TOPICAL
  Filled 2017-01-12: qty 113

## 2017-01-12 MED ORDER — SODIUM CHLORIDE 0.9 % IV SOLN: INTRAVENOUS | Status: DC

## 2017-01-12 MED ORDER — DEXTROSE 5 % IV SOLN
2.0000 g | INTRAVENOUS | Status: DC
  Administered 2017-01-12 – 2017-01-19 (×8): 2 g via INTRAVENOUS
  Filled 2017-01-12 (×10): qty 2

## 2017-01-12 MED ORDER — DEXTROSE 5 % IV SOLN
250.0000 mg | INTRAVENOUS | Status: DC
  Filled 2017-01-12: qty 250

## 2017-01-12 MED ORDER — LEVOFLOXACIN IN D5W 750 MG/150ML IV SOLN: 750.0000 mg | Freq: Once | INTRAVENOUS | Status: DC

## 2017-01-12 MED ORDER — CHLORHEXIDINE GLUCONATE 0.12 % MT SOLN
15.0000 mL | Freq: Two times a day (BID) | OROMUCOSAL | Status: DC
  Administered 2017-01-12 – 2017-01-19 (×15): 15 mL via OROMUCOSAL
  Filled 2017-01-12 (×11): qty 15

## 2017-01-12 MED ORDER — PNEUMOCOCCAL VAC POLYVALENT 25 MCG/0.5ML IJ INJ
0.5000 mL | INJECTION | INTRAMUSCULAR | Status: DC
  Filled 2017-01-12: qty 0.5

## 2017-01-12 MED ORDER — NOREPINEPHRINE BITARTRATE 1 MG/ML IV SOLN
5.0000 ug/min | INTRAVENOUS | Status: DC
  Administered 2017-01-12: 5 ug/min via INTRAVENOUS
  Filled 2017-01-12: qty 4

## 2017-01-12 MED ORDER — ENOXAPARIN SODIUM 40 MG/0.4ML ~~LOC~~ SOLN
40.0000 mg | SUBCUTANEOUS | Status: DC
  Administered 2017-01-12 – 2017-01-19 (×8): 40 mg via SUBCUTANEOUS
  Filled 2017-01-12 (×8): qty 0.4

## 2017-01-12 MED ORDER — DEXTROSE 5 % IV SOLN
250.0000 mg | Freq: Once | INTRAVENOUS | Status: DC
  Filled 2017-01-12: qty 250

## 2017-01-12 MED ORDER — SODIUM CHLORIDE 0.9 % IV BOLUS (SEPSIS)
1000.0000 mL | Freq: Once | INTRAVENOUS | Status: AC
  Administered 2017-01-12: 1000 mL via INTRAVENOUS

## 2017-01-12 MED ORDER — COLLAGENASE 250 UNIT/GM EX OINT
TOPICAL_OINTMENT | Freq: Every day | CUTANEOUS | Status: DC
  Administered 2017-01-12: 11:00:00 via TOPICAL
  Administered 2017-01-13: 1 via TOPICAL
  Administered 2017-01-14 – 2017-01-19 (×6): via TOPICAL
  Filled 2017-01-12: qty 30

## 2017-01-12 MED ORDER — ASPIRIN 325 MG PO TABS
325.0000 mg | ORAL_TABLET | Freq: Every day | ORAL | Status: DC
  Administered 2017-01-19: 325 mg via ORAL
  Filled 2017-01-12: qty 1

## 2017-01-12 MED ORDER — ASPIRIN 325 MG PO TABS: 325.0000 mg | ORAL_TABLET | Freq: Every day | ORAL | Status: DC

## 2017-01-12 MED ORDER — DEXTROSE 5 % IV SOLN
500.0000 mg | Freq: Once | INTRAVENOUS | Status: DC
  Administered 2017-01-12: 500 mg via INTRAVENOUS
  Filled 2017-01-12: qty 500

## 2017-01-12 MED ORDER — LORAZEPAM 2 MG/ML IJ SOLN
1.0000 mg | Freq: Once | INTRAMUSCULAR | Status: AC
  Administered 2017-01-12: 1 mg via INTRAVENOUS
  Filled 2017-01-12: qty 1

## 2017-01-12 MED ORDER — ASPIRIN 300 MG RE SUPP: 300.0000 mg | Freq: Every day | RECTAL | Status: DC

## 2017-01-12 MED ORDER — ORAL CARE MOUTH RINSE
15.0000 mL | Freq: Two times a day (BID) | OROMUCOSAL | Status: DC
Start: 2017-01-13 — End: 2017-01-20
  Administered 2017-01-13 – 2017-01-19 (×13): 15 mL via OROMUCOSAL

## 2017-01-12 MED ORDER — VANCOMYCIN HCL IN DEXTROSE 1-5 GM/200ML-% IV SOLN
1000.0000 mg | Freq: Once | INTRAVENOUS | Status: AC
  Administered 2017-01-12: 1000 mg via INTRAVENOUS

## 2017-01-12 MED ORDER — ASPIRIN 300 MG RE SUPP
300.0000 mg | Freq: Every day | RECTAL | Status: DC
  Administered 2017-01-12 – 2017-01-18 (×7): 300 mg via RECTAL
  Filled 2017-01-12 (×7): qty 1

## 2017-01-12 MED ORDER — DEXTROSE 5 % IV SOLN
1.0000 g | INTRAVENOUS | Status: DC
  Administered 2017-01-12: 1 g via INTRAVENOUS
  Filled 2017-01-12 (×2): qty 10

## 2017-01-12 MED ORDER — NOREPINEPHRINE BITARTRATE 1 MG/ML IV SOLN
5.0000 ug/min | INTRAVENOUS | Status: DC
  Administered 2017-01-14: 7 ug/min via INTRAVENOUS
  Filled 2017-01-12 (×3): qty 16

## 2017-01-12 MED ORDER — FENTANYL CITRATE (PF) 100 MCG/2ML IJ SOLN
12.5000 ug | INTRAMUSCULAR | Status: DC | PRN
  Administered 2017-01-12: 12.5 ug via INTRAVENOUS
  Filled 2017-01-12: qty 2

## 2017-01-12 MED ORDER — IPRATROPIUM-ALBUTEROL 0.5-2.5 (3) MG/3ML IN SOLN
3.0000 mL | RESPIRATORY_TRACT | Status: DC
  Administered 2017-01-12 – 2017-01-13 (×8): 3 mL via RESPIRATORY_TRACT
  Filled 2017-01-12 (×8): qty 3

## 2017-01-12 MED ORDER — INFLUENZA VAC SPLIT HIGH-DOSE 0.5 ML IM SUSY
0.5000 mL | PREFILLED_SYRINGE | INTRAMUSCULAR | Status: DC
  Filled 2017-01-12: qty 0.5

## 2017-01-12 MED ORDER — SODIUM CHLORIDE 0.9 % IV SOLN
INTRAVENOUS | Status: AC
  Administered 2017-01-12: 07:00:00 via INTRAVENOUS

## 2017-01-12 MED ORDER — ASPIRIN 81 MG PO CHEW: 324.0000 mg | CHEWABLE_TABLET | Freq: Once | ORAL | Status: DC

## 2017-01-12 MED ORDER — SODIUM CHLORIDE 0.9 % IV BOLUS (SEPSIS)
500.0000 mL | Freq: Once | INTRAVENOUS | Status: AC
  Administered 2017-01-12: 500 mL via INTRAVENOUS

## 2017-01-12 NOTE — Progress Notes (Signed)
*  PRELIMINARY RESULTS* Echocardiogram 2D Echocardiogram has been performed.  [name], [name] 01/12/2017, 4:23 PM

## 2017-01-12 NOTE — Progress Notes (Signed)
PT Cancellation Note  Patient Details Name: Sandrea HughsRuth J Stilley MRN: [id number] DOB: 1925-09-20   Cancelled Treatment:    Reason Eval/Treat Not Completed: Medical issues which prohibited therapy  Patient transferred to high level care and will require new Physical therapy consult to assess patient when medically stable. Thank you.  Glyn Adeachel Quinn, PT, DPT Physical Therapist with [facility]  01/12/2017 3:24 PM

## 2017-01-12 NOTE — Progress Notes (Addendum)
Patient in MRI at 0830 unable to do neuro and vitals signs, patient back to room at 0930 started back at that time.

## 2017-01-12 NOTE — Progress Notes (Signed)
OT Cancellation Note  Patient Details Name: [name] MRN: [id number] DOB: Feb 02, 1926   Cancelled Treatment:    Reason Eval/Treat Not Completed: Fatigue/lethargy limiting ability to participate (with RN for assessment, given ativan for upcoming test, not arousable) Therapist will attempt evaluation later this date or 01/13/17 as schedule allows.    Shirlean MylarBethany H. Mckinzey Entwistle, MHA, OTR/L [phone]  01/12/2017, 9:43 AM

## 2017-01-12 NOTE — Progress Notes (Signed)
01/12/2017 12:04 PM  Called to see patient as she became hypotensive and hypothermic.  MRI positive for acute CVA.  Also patient has multifocal pneumonia and UTI.  I suspect she is septic.  Start Lawyerbair hugger, bolus IVFs, broaden antibiotic coverage.  I spoke with patient's son at bedside and at this time he wants patient to remain full code.  I am transferring patient to ICU.  I am ordering foley, and PICC line in case she needs levofed infusion if BP does not improve.    Critical Care time spent 32 mins  [name]. Keimani Laufer MD

## 2017-01-12 NOTE — Plan of Care (Signed)
Problem: Self-Care: Goal: Ability to participate in self-care as condition permits will improve Outcome: Not Progressing Pt unable to follow commands at this time

## 2017-01-12 NOTE — Consult Note (Signed)
Andrew A. Merlene Laughter, MD     [email or web address]          [name] is an 83 y.o. female.   ASSESSMENT/PLAN: 1. Marked toxic metabolic encephalopathy due to urosepsis:   The patient should improve as the infection improved.  This may take several days to weeks however for the patient returned to baseline.    2. Questionable tiny stroke involving the left frontal area:  The area of questionable infarcts is unknown air for artifact.  Even if these are real, they are unlikely to explain the patient's encephalopathy given the minute size.  Agree with antiplatelet aspirin.           This 83 year old female who presents with marked unresponsiveness and altered mental status the last several days.  Workup has been significant for significant UTI and hypothermia.  She has been quite unresponsive during this hospitalization.  There has been some gradual improvement today.   She stays at home and has nursing assistance that comes to the house. Review of systems limited given the encephalopathy.    GENERAL:   She is moaning and groaning but in no acute distress.  HEENT:   Neck is supple; she vigorously keep her eyes closed and prevents them from being opened  ABDOMEN: soft  EXTREMITIES:  Status post extensive knee procedures bilaterally.  There is brown of the skin with significant scaly appearance of the legs.  Legs are contracted.   BACK:  Unremarkable  SKIN: Normal by inspection.    MENTAL STATUS: Alert and oriented. Speech, language and cognition are generally intact. Judgment and insight normal.   CRANIAL NERVES: Pupils are equal, round and reactive to light; extra ocular movements are full, there is no significant nystagmus; visual fields are full; upper and lower facial muscles are normal in strength and symmetric, there is no flattening of the nasolabial folds.  MOTOR:  She has antigravity strength in the upper extremities but limited movement in the legs  as they are contracted.  COORDINATION:   No tremors or dysmetria appreciated.  No parkinsonism.  REFLEXES: Deep tendon reflexes are symmetrical and normal. Babinski reflexes are flexor bilaterally.   SENSATION:  She responds to painful stimuli bilaterally.      Blood pressure (!) 98/54, pulse 61, temperature (!) 97.2 F (36.2 C), temperature source Rectal, resp. rate 18, height _0  (1.626 m), weight 144 lb 1.6 oz (65.4 kg), SpO2 98 %.  Past Medical History:  Diagnosis Date  . Diabetes mellitus without complication [facility])     Past Surgical History:  Procedure Laterality Date  . JOINT REPLACEMENT    . ORIF PERIPROSTHETIC FRACTURE Left 05/23/2014   Procedure: OPEN REDUCTION INTERNAL FIXATION (ORIF) PERIPROSTHETIC FRACTURE;  Surgeon: Rod Can, MD;  Location: [facility];  Service: Orthopedics;  Laterality: Left;    Family History  Problem Relation Age of Onset  . Stroke Mother   . Hypertension Father     Social History:  reports that she has quit smoking. She has never used smokeless tobacco. She reports that she does not drink alcohol or use drugs.  Allergies:  Allergies  Allergen Reactions  . Codeine Nausea And Vomiting  . Penicillins Nausea And Vomiting    Medications: Prior to Admission medications   Medication Sig Start Date End Date Taking? Authorizing Provider  calcium-vitamin D (OSCAL WITH D) 500-200 MG-UNIT per tablet Take 1 tablet by mouth 2 (two) times daily with a meal. 05/26/14   Florencia Reasons, MD  enoxaparin (LOVENOX) 40 MG/0.4ML injection Inject 0.4 mLs (40 mg total) into the skin daily. Patient not taking: Reported on 10/31/2016 05/24/14   Rod Can, MD  feeding supplement, ENSURE COMPLETE, (ENSURE COMPLETE) LIQD Take 237 mLs by mouth 3 (three) times daily between meals. Patient not taking: Reported on 10/31/2016 05/26/14   Florencia Reasons, MD  Misc Natural Products (FLEX-A-MIN JOINT FLEX) TABS Take 1 tablet by mouth daily.    [provider]  senna-docusate  (SENOKOT S) 8.6-50 MG per tablet Take 1 tablet by mouth at bedtime. 05/26/14   Florencia Reasons, MD  traMADol (ULTRAM) 50 MG tablet Take 0.5 tablets (25 mg total) by mouth every 6 (six) hours as needed for moderate pain. 05/29/14   Reed, Tiffany L, DO  Turmeric Curcumin 500 MG CAPS Take 1 capsule by mouth daily as needed (for pain).    [provider]    Scheduled Meds: .  stroke: mapping our early stages of recovery book   Does not apply Once  . aspirin  324 mg Oral Once  . enoxaparin (LOVENOX) injection  40 mg Subcutaneous Q24H  . [START ON 01/13/2017] Influenza vac split quadrivalent PF  0.5 mL Intramuscular Tomorrow-1000  . LORazepam  1 mg Intravenous Once  . [START ON 01/13/2017] pneumococcal 23 valent vaccine  0.5 mL Intramuscular Tomorrow-1000   Continuous Infusions: . sodium chloride 10 mL/hr at 01/12/17 0656  . cefTRIAXone (ROCEPHIN)  IV 1 g (01/12/17 0617)   PRN Meds:.fentaNYL (SUBLIMAZE) injection     Results for orders placed or performed during the hospital encounter of 01/06/2017 (from the past 48 hour(s))  Urinalysis, Routine w reflex microscopic     Status: Abnormal   Collection Time: 01/04/2017 12:05 AM  Result Value Ref Range   Color, Urine YELLOW YELLOW   APPearance HAZY (A) CLEAR   Specific Gravity, Urine 1.020 1.005 - 1.030   pH 7.0 5.0 - 8.0   Glucose, UA NEGATIVE NEGATIVE mg/dL   Hgb urine dipstick MODERATE (A) NEGATIVE   Bilirubin Urine NEGATIVE NEGATIVE   Ketones, ur NEGATIVE NEGATIVE mg/dL   Protein, ur 100 (A) NEGATIVE mg/dL   Nitrite POSITIVE (A) NEGATIVE   Leukocytes, UA LARGE (A) NEGATIVE   RBC / HPF TOO NUMEROUS TO COUNT 0 - 5 RBC/hpf   WBC, UA TOO NUMEROUS TO COUNT 0 - 5 WBC/hpf   Bacteria, UA MANY (A) NONE SEEN   Squamous Epithelial / LPF 0-5 (A) NONE SEEN   Mucus PRESENT   CBC with Differential     Status: Abnormal   Collection Time: 01/12/2017 10:31 PM  Result Value Ref Range   WBC 9.3 4.0 - 10.5 K/uL   RBC 3.89 3.87 - 5.11 MIL/uL    Hemoglobin 11.9 (L) 12.0 - 15.0 g/dL   HCT 36.7 36.0 - 46.0 %   MCV 94.3 78.0 - 100.0 fL   MCH 30.6 26.0 - 34.0 pg   MCHC 32.4 30.0 - 36.0 g/dL   RDW 14.0 11.5 - 15.5 %   Platelets 181 150 - 400 K/uL   Neutrophils Relative % 91 %   Neutro Abs 8.4 (H) 1.7 - 7.7 K/uL   Lymphocytes Relative 4 %   Lymphs Abs 0.3 (L) 0.7 - 4.0 K/uL   Monocytes Relative 5 %   Monocytes Absolute 0.5 0.1 - 1.0 K/uL   Eosinophils Relative 0 %   Eosinophils Absolute 0.0 0.0 - 0.7 K/uL   Basophils Relative 0 %   Basophils Absolute 0.0 0.0 - 0.1 K/uL  Basic metabolic panel     Status: Abnormal   Collection Time: 12/24/2016 10:31 PM  Result Value Ref Range   Sodium 139 135 - 145 mmol/L   Potassium 4.0 3.5 - 5.1 mmol/L   Chloride 103 101 - 111 mmol/L   CO2 28 22 - 32 mmol/L   Glucose, Bld 118 (H) 65 - 99 mg/dL   BUN 9 6 - 20 mg/dL   Creatinine, Ser 0.42 (L) 0.44 - 1.00 mg/dL   Calcium 9.6 8.9 - 10.3 mg/dL   GFR calc non Af Amer >60 >60 mL/min   GFR calc Af Amer >60 >60 mL/min    Comment: (NOTE) The eGFR has been calculated using the CKD EPI equation. This calculation has not been validated in all clinical situations. eGFR's persistently <60 mL/min signify possible Chronic Kidney Disease.    Anion gap 8 5 - 15  Glucose, capillary     Status: Abnormal   Collection Time: 01/12/17  2:46 AM  Result Value Ref Range   Glucose-Capillary 108 (H) 65 - 99 mg/dL  Lipid panel     Status: None   Collection Time: 01/12/17  5:18 AM  Result Value Ref Range   Cholesterol 153 0 - 200 mg/dL   Triglycerides 49 <150 mg/dL   HDL 48 >40 mg/dL   Total CHOL/HDL Ratio 3.2 RATIO   VLDL 10 0 - 40 mg/dL   LDL Cholesterol 95 0 - 99 mg/dL    Comment:        Total Cholesterol/HDL:CHD Risk Coronary Heart Disease Risk Table                     Men   Women  1/2 Average Risk   3.4   3.3  Average Risk       5.0   4.4  2 X Average Risk   9.6   7.1  3 X Average Risk  23.4   11.0        Use the calculated Patient Ratio above and  the CHD Risk Table to determine the patient's CHD Risk.        ATP III CLASSIFICATION (LDL):  <100     mg/dL   Optimal  100-129  mg/dL   Near or Above                    Optimal  130-159  mg/dL   Borderline  160-189  mg/dL   High  >190     mg/dL   Very High   Comprehensive metabolic panel     Status: Abnormal   Collection Time: 01/12/17  5:18 AM  Result Value Ref Range   Sodium 137 135 - 145 mmol/L   Potassium 3.7 3.5 - 5.1 mmol/L   Chloride 103 101 - 111 mmol/L   CO2 29 22 - 32 mmol/L   Glucose, Bld 106 (H) 65 - 99 mg/dL   BUN 8 6 - 20 mg/dL   Creatinine, Ser 0.36 (L) 0.44 - 1.00 mg/dL   Calcium 8.9 8.9 - 10.3 mg/dL   Total Protein 6.4 (L) 6.5 - 8.1 g/dL   Albumin 2.8 (L) 3.5 - 5.0 g/dL   AST 35 15 - 41 U/L   ALT 25 14 - 54 U/L   Alkaline Phosphatase 78 38 - 126 U/L   Total Bilirubin 0.5 0.3 - 1.2 mg/dL   GFR calc non Af Amer >60 >60 mL/min   GFR calc Af Amer >60 >60 mL/min  Comment: (NOTE) The eGFR has been calculated using the CKD EPI equation. This calculation has not been validated in all clinical situations. eGFR's persistently <60 mL/min signify possible Chronic Kidney Disease.    Anion gap 5 5 - 15  Reticulocytes     Status: Abnormal   Collection Time: 01/12/17  5:18 AM  Result Value Ref Range   Retic Ct Pct 1.6 0.4 - 3.1 %   RBC. 3.52 (L) 3.87 - 5.11 MIL/uL   Retic Count, Absolute 56.3 19.0 - 186.0 K/uL  TSH     Status: Abnormal   Collection Time: 01/12/17  5:18 AM  Result Value Ref Range   TSH 9.641 (H) 0.350 - 4.500 uIU/mL    Comment: Performed by a 3rd Generation assay with a functional sensitivity of <=0.01 uIU/mL.  Glucose, capillary     Status: Abnormal   Collection Time: 01/12/17  7:28 AM  Result Value Ref Range   Glucose-Capillary 107 (H) 65 - 99 mg/dL   Comment 1 Notify RN    Comment 2 Document in Chart     Studies/Results:    HEAD CT FINDINGS: Brain: There is mild age-related atrophy and chronic microvascular ischemic changes. There  is no acute intracranial hemorrhage. No mass effect or midline shift noted. No extra-axial fluid collection. There is prominence of the posterior fossa CSF space which may represent a mega cisterna magna versus an arachnoid cyst. MRI may provide better evaluation.  Vascular: No hyperdense vessel or unexpected calcification.  Skull: Normal. Negative for fracture or focal lesion.  Sinuses/Orbits: No acute finding.  Other: None  IMPRESSION: 1. No acute intracranial hemorrhage. 2. Age-related atrophy and chronic microvascular ischemic changes. If symptoms persist, and there are no contraindications, MRI may provide better evaluation if clinically indicated. 3. 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      BRAIN MRI MRA Brain: DWI axial series was repeated, and on the second set, there are two foci of restricted diffusion which possibly represent acute LEFT frontal cortical infarction, see image 91 series 24. I am unable to confidently identify these foci on the first axial diffusion sequence nor are they seen on the only coronal diffusion sequence.  Advanced atrophy with chronic microvascular ischemic change. No definite hemorrhage, mass lesion, hydrocephalus, or extra-axial fluid.  Vascular: Flow voids are maintained.  Skull and upper cervical spine: Mega cisterna magna. Marrow signal grossly unremarkable. Pannus appears to surround the odontoid.  Sinuses/Orbits: No layering sinus fluid.  No orbital findings.  Other: None.  MRA HEAD FINDINGS  Gross patency of the internal carotid arteries, basilar artery, and both vertebrals is established. Suspected BILATERAL 50% supraclinoid stenoses of both internal carotid arteries. Due to tortuosity and motion, there is artifactual signal loss in the LEFT M1 MCA. RIGHT M1 MCA is patent as are both anterior and posterior cerebral arteries. Bilobed M2 branches appear patent. Cerebellar  branches poorly visualized. No definite saccular aneurysm.  IMPRESSION: Motion degraded exam demonstrate possible subcentimeter foci of acute nonhemorrhagic infarction in the LEFT frontal lobe. See discussion above.  Advanced atrophy and small vessel disease.  Major intracranial vessels are patent on MRA, without definite flow-limiting stenosis.          The brain MRI is reviewed in person in shows two tiny crease in all involving the left frontal region.  This is in the anterior frontal area where there is noted to be artifact.  The patient is moving quite a bit which degrades the study.  Reduced signal is not appreciated on the ADC scan and the two abnormalities seen involving the left frontal region is seen only on the axial not on the coronal scans.  States she does have significant global atrophy.    TTE - Left ventricle: The cavity size was mildly reduced. Wall   thickness was increased in a pattern of mild LVH. Systolic   function was normal. The estimated ejection fraction was in the   range of 60% to 65%. - Aortic valve: AV is thickened, calcified with mildly restricted   motion. - Mitral valve: MV is moderately thickened There is moderate   annular calcification.Shadow seen at base of anterior mitral   leaflet that most likely represents shadowing from aortic   annulus. Cannot completely exclude vegetation. REcomm TEE if   clinically indicated.      CAROTIDS ARE UNREMARKABLE.  Litzi Binning A. Merlene Laughter, M.D.  Diplomate, Tax adviser of Psychiatry and Neurology ( Neurology). 01/12/2017, 8:21 AM

## 2017-01-12 NOTE — ED Provider Notes (Signed)
[facility] EMERGENCY DEPARTMENT Provider Note   CSN: [id number] Arrival date & time: 01/10/2017  2217   An emergency department physician performed an initial assessment on this suspected stroke patient at 2231.  History   Chief Complaint Chief Complaint  Patient presents with  . Aphasia    HPI [name] is a 83 y.o. female.  HPI   83 year old female with dysarthria.  History is primarily from son at bedside.  Onset about 2 days ago.  Persistent since then.  She appears to be at her baseline otherwise.  She lives at home.  She is nonambulatory.  She is checked on daily by family or nursing to provide wound care.  She denies any pain but I cannot get much additional history from her. Son reports he speech is normally pretty fluent.    History reviewed. No pertinent past medical history.  Patient Active Problem List   Diagnosis Date Noted  . Diabetes type 2, controlled (HCC) 07/02/2014  . Femur fracture, left (HCC) 05/23/2014  . Peri-prosthetic fracture of femur following total hip arthroplasty 05/23/2014  . Fall at home   . Undiagnosed cardiac murmurs   . FTT (failure to thrive) in adult   . Osteoarthrosis, unspecified whether generalized or localized, involving lower leg 02/28/2008  . KNEE PAIN 08/03/2007  . DISC DEGENERATION 08/03/2007    Past Surgical History:  Procedure Laterality Date  . JOINT REPLACEMENT    . ORIF PERIPROSTHETIC FRACTURE Left 05/23/2014   Procedure: OPEN REDUCTION INTERNAL FIXATION (ORIF) PERIPROSTHETIC FRACTURE;  Surgeon: Samson Frederic, MD;  Location: [facility] OR;  Service: Orthopedics;  Laterality: Left;    OB History    No data available       Home Medications    Prior to Admission medications   Medication Sig Start Date End Date Taking? Authorizing Provider  calcium-vitamin D (OSCAL WITH D) 500-200 MG-UNIT per tablet Take 1 tablet by mouth 2 (two) times daily with a meal. 05/26/14   Albertine Grates, MD  enoxaparin (LOVENOX) 40 MG/0.4ML injection  Inject 0.4 mLs (40 mg total) into the skin daily. Patient not taking: Reported on 10/31/2016 05/24/14   Samson Frederic, MD  feeding supplement, ENSURE COMPLETE, (ENSURE COMPLETE) LIQD Take 237 mLs by mouth 3 (three) times daily between meals. Patient not taking: Reported on 10/31/2016 05/26/14   Albertine Grates, MD  Misc Natural Products (FLEX-A-MIN JOINT FLEX) TABS Take 1 tablet by mouth daily.    [provider]  senna-docusate (SENOKOT S) 8.6-50 MG per tablet Take 1 tablet by mouth at bedtime. 05/26/14   Albertine Grates, MD  traMADol (ULTRAM) 50 MG tablet Take 0.5 tablets (25 mg total) by mouth every 6 (six) hours as needed for moderate pain. 05/29/14   Reed, Tiffany L, DO  Turmeric Curcumin 500 MG CAPS Take 1 capsule by mouth daily as needed (for pain).    [provider]    Family History History reviewed. No pertinent family history.  Social History Social History  Substance Use Topics  . Smoking status: Former Games developer  . Smokeless tobacco: Never Used  . Alcohol use No     Allergies   Codeine and Penicillins   Review of Systems Review of Systems  All systems reviewed and negative, other than as noted in HPI.  Physical Exam Updated Vital Signs BP 136/81   Pulse 61   Ht 5\' 8"  (1.727 m)   Wt 64.6 kg (142 lb 6.7 oz)   SpO2 100%   BMI 21.65 kg/m  Physical Exam  Constitutional: She appears well-developed.  Disheveled appearance  HENT:  Head: Normocephalic and atraumatic.  Eyes: Conjunctivae are normal. Right eye exhibits no discharge. Left eye exhibits no discharge.  Neck: Neck supple.  Cardiovascular: Normal rate, regular rhythm and normal heart sounds.  Exam reveals no gallop and no friction rub.   No murmur heard. Pulmonary/Chest: Effort normal and breath sounds normal. No respiratory distress.  Abdominal: Soft. She exhibits no distension. There is no tenderness.  Genitourinary:  Genitourinary Comments: Several sacral/buttock wounds.  Wound to right buttock with  some tunneling.  No drainage.  No surrounding cellulitis. Wounds were not dressed. But did have old appearing tape still adherent in some areas.  Musculoskeletal: She exhibits no edema or tenderness.  Bilateral lower extremities are wrapped.  Severe edema.  Able to wiggle her toes.  Feet appear to be reasonably well perfused.  Warm to touch.  Neurological: She is alert.  Severe dysarthria to the point of speech being very difficult to understand.  Right eye ptosis which son reports is chronic.  Tongue does appear to deviate to the right.  5 out of 5 bilateral upper extremities. 3/5 b/l LE.   Skin: Skin is warm and dry.  Psychiatric: She has a normal mood and affect. Her behavior is normal. Thought content normal.  Nursing note and vitals reviewed.    ED Treatments / Results  Labs (all labs ordered are listed, but only abnormal results are displayed) Labs Reviewed  CBC WITH DIFFERENTIAL/PLATELET - Abnormal; Notable for the following:       Result Value   Hemoglobin 11.9 (*)    Neutro Abs 8.4 (*)    Lymphs Abs 0.3 (*)    All other components within normal limits  BASIC METABOLIC PANEL - Abnormal; Notable for the following:    Glucose, Bld 118 (*)    Creatinine, Ser 0.42 (*)    All other components within normal limits  URINALYSIS, ROUTINE W REFLEX MICROSCOPIC    EKG  EKG Interpretation  Date/Time:  Sunday January 11 2017 22:39:02 EDT Ventricular Rate:  56 PR Interval:    QRS Duration: 113 QT Interval:  575 QTC Calculation: 556 R Axis:   44 Text Interpretation:  Sinus rhythm Prolonged PR interval Consider left atrial enlargement Prolonged QT interval Baseline wander in lead(s) V4 V5 Confirmed by Raeford RazorKohut, Amie Cowens [phone]([id number]) on 01/12/2017 12:17:52 AM       Radiology Ct Head Wo Contrast  Result Date: 11-17-2016 CLINICAL DATA:  83 year old female with altered mental status. EXAM: CT HEAD WITHOUT CONTRAST TECHNIQUE: Contiguous axial images were obtained from the base of the skull  through the vertex without intravenous contrast. COMPARISON:  None. FINDINGS: Brain: There is mild age-related atrophy and chronic microvascular ischemic changes. There is no acute intracranial hemorrhage. No mass effect or midline shift noted. No extra-axial fluid collection. There is prominence of the posterior fossa CSF space which may represent a mega cisterna magna versus an arachnoid cyst. MRI may provide better evaluation. Vascular: No hyperdense vessel or unexpected calcification. Skull: Normal. Negative for fracture or focal lesion. Sinuses/Orbits: No acute finding. Other: None IMPRESSION: 1. No acute intracranial hemorrhage. 2. Age-related atrophy and chronic microvascular ischemic changes. If symptoms persist, and there are no contraindications, MRI may provide better evaluation if clinically indicated. 3. 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 Electronically Signed   By: Elgie CollardArash  Radparvar M.D.   On: 11-17-2016 23:44    Procedures Procedures (including critical care time)  Medications Ordered in ED Medications - No data to display   Initial Impression / Assessment and Plan / ED Course  I have reviewed the triage vital signs and the nursing notes.  Pertinent labs & imaging results that were available during my care of the patient were reviewed by me and considered in my medical decision making (see chart for details).     83 year old female with severe dysarthria.  Onset several days ago.  CT the head with no acute findings.  Will admit for further workup.  I think she may benefit from social work consult as well.  She apparently lives at home.  She arrived to the ED very disheveled.  Her legs seem like they are getting reasonably good attention but does not appear to be getting adequate wound care to her buttocks/sacral wounds.  Final Clinical Impressions(s) / ED Diagnoses   Final diagnoses:  Dysarthria    New Prescriptions New Prescriptions   No  medications on file     Raeford Razor, MD 01/12/17 [phone]

## 2017-01-12 NOTE — Patient Outreach (Signed)
[facility] Care Management  01/12/2017  Kathryn HughsRuth J Page November 12, 1925 [id number]  Call received from Mr. Alanda SlimJesse Dick this morning who reported that [name] developed altered speech pattern, confusion, and cognitive changes yesterday and presented to [facility] via EMS. As of yesterday evening, [name] has been admitted to the hospital for evaluation of her ongoing symptoms. She has a UTI and is being evaluated for possible CVA.   Home Prior to Admission Prior to admission, [name] lived at home with her adult son and his wife who are very attentive but who both work outside the home. Mr.Dils works varying 12 hour shifts and at least 2 days/week is in the home all day with [name]. On other days, [name] is alone for as many as 8 hours before her daughter in law arrives home after work. An adult grandchild also comes by to help and check on [name].   [name] is currently receiving home health nursing and physical therapy via [facility]. She has reported intermittent urinary symptoms including urinary urgency and frequency. She has been intermittently incontinent of urine for several months. She has been taking Lasix 20mg  po qd at home. This dose was decreased by Dr. Dwana MelenaZack Hall in September.   Discharge Planning Today, I discussed with Mr. Paulla Page that a nurse case manager and/or social worker at the hospital, in addition to the [facility]iaison, will discuss discharge planning with him and [name] which may include resumed home health services or even rehabilitation in a facility if warranted. Her verbalized understanding and affirmed that he would be prepared for these discussions.  Plan: I will notify the [facility] Hospital Liaison of Mrs. Chambless's admission and our current involvement.   I will follow [name]'s progress closely.    Marja Kayslisa Finola Rosal MHA,BSN,RN,CCM [facility] Care [facility]  [phone]

## 2017-01-12 NOTE — H&P (Signed)
History and Physical    [name]:[id number] DOB: 1925/08/16 DOA: 12/25/2016  PCP: Benita Stabile, MD   Patient coming from: Home.  I have personally briefly reviewed patient's old medical records in [facility]  Chief Complaint: Garbled speech for 2 days.  HPI: [name] is a 83 y.o. female with medical history significant of type 2 diabetes who was brought to the emergency department by her son stating that for the past 2 days she has been having "garbled speech", decreased mentation, decreased oral intake and confusion. The patient lives by herself and is wheelchair-bound, but her son, daughter-in-law and grandkids live close by and help her daily several times a day. Her son mentions that she has been a slowly declining for the past week or so and has been battling "a chest cold". No fever or chills to his knowledge. Recently, she was prescribed furosemide 20 mg by mouth daily with potassium supplementation for lower extremity edema. The patient is able to say her name and answers are simple questions with yes or no, but is unable to elaborate into her symptoms.   ED Course: Initial blood pressure in the emergency department was 136/81 mmHg, pulse 61, respirations 20 and O2 sat 100% on room air.   Workup: Urinalysis shows pyuria, bacteriuria, positive nitrates, moderate hemoglobinuria and mild proteinuria. WBC was 9.3 with normal differential, hemoglobin 11.9 g/dL and platelets 811. BMP showed a glucose of 118 mg/dL, but all other values were unremarkable. Her CT scan of the brain showed extensive atrophy, but no acute intracranial pathology. Please see images and full radiology report for further detail.   Review of Systems:  unable to obtain due to the patient's mental status..    Past Medical History:  Diagnosis Date  . Diabetes mellitus without complication [facility])     Past Surgical History:  Procedure Laterality Date  . JOINT REPLACEMENT    . ORIF PERIPROSTHETIC  FRACTURE Left 05/23/2014   Procedure: OPEN REDUCTION INTERNAL FIXATION (ORIF) PERIPROSTHETIC FRACTURE;  Surgeon: Samson Frederic, MD;  Location: [facility] OR;  Service: Orthopedics;  Laterality: Left;     reports that she has quit smoking. She has never used smokeless tobacco. She reports that she does not drink alcohol or use drugs.  Allergies  Allergen Reactions  . Codeine Nausea And Vomiting  . Penicillins Nausea And Vomiting    Family History  Problem Relation Age of Onset  . Stroke Mother   . Hypertension Father     Prior to Admission medications   Medication Sig Start Date End Date Taking? Authorizing Provider  calcium-vitamin D (OSCAL WITH D) 500-200 MG-UNIT per tablet Take 1 tablet by mouth 2 (two) times daily with a meal. 05/26/14   Albertine Grates, MD  enoxaparin (LOVENOX) 40 MG/0.4ML injection Inject 0.4 mLs (40 mg total) into the skin daily. Patient not taking: Reported on 10/31/2016 05/24/14   Samson Frederic, MD  feeding supplement, ENSURE COMPLETE, (ENSURE COMPLETE) LIQD Take 237 mLs by mouth 3 (three) times daily between meals. Patient not taking: Reported on 10/31/2016 05/26/14   Albertine Grates, MD  Misc Natural Products (FLEX-A-MIN JOINT FLEX) TABS Take 1 tablet by mouth daily.    [provider]  senna-docusate (SENOKOT S) 8.6-50 MG per tablet Take 1 tablet by mouth at bedtime. 05/26/14   Albertine Grates, MD  traMADol (ULTRAM) 50 MG tablet Take 0.5 tablets (25 mg total) by mouth every 6 (six) hours as needed for moderate pain. 05/29/14   Renato Gails,  Tiffany L, DO  Turmeric Curcumin 500 MG CAPS Take 1 capsule by mouth daily as needed (for pain).    [provider]    Physical Exam: Vitals:   12/26/2016 2227 12/28/2016 2230 01/12/17 0008 01/12/17 0200  BP:  136/81 114/65 (!) 96/59  Pulse:  61 (!) 58 (!) 59  Resp:   20 18  SpO2:  100% 97% 98%  Weight: 64.6 kg (142 lb 6.7 oz)     Height: 5\' 8"  (1.727 m)       Constitutional:  NAD, calm, comfortable Eyes: PERRL, lids and conjunctivae  normal ENMT: Mucous membranes are moist. Posterior pharynx clear of any exudate or lesions.  Neck: normal, supple, no masses, no thyromegaly Respiratory: Decreased breath sounds on bases with bilateral rhonchi . Normal respiratory effort. No accessory muscle use.  Cardiovascular  bradycardic at 58 BPM, no murmurs / rubs / gallops. No extremity edema. 2+ pedal pulses. No carotid bruits.  Abdomen:  soft,no tenderness, no masses palpated. No hepatosplenomegaly. Bowel sounds positive.  Musculoskeletal: no clubbing / cyanosis.  decreased lower extremity ROM, no contractures.  Skin:  positive pressure ulcers on sacral/gluteal area bilateral lower extremities pitting edema and dressings Neurologic:  Her speech is dysarthric, has good bilateral handgrip, right eye ptosis and bilateral lower extremity weakness, which her son states are chronic. Psychiatric: Obtunded, but briefly arousable. Oriented to name only. Follows simple commands.   Labs on Admission: I have personally reviewed following labs and imaging studies  CBC:  Recent Labs Lab 01/08/2017 2231  WBC 9.3  NEUTROABS 8.4*  HGB 11.9*  HCT 36.7  MCV 94.3  PLT 181   Basic Metabolic Panel:  Recent Labs Lab 12/23/2016 2231  NA 139  K 4.0  CL 103  CO2 28  GLUCOSE 118*  BUN 9  CREATININE 0.42*  CALCIUM 9.6   GFR: Estimated Creatinine Clearance: 48.1 mL/min (A) (by C-G formula based on SCr of 0.42 mg/dL (L)). Liver Function Tests: No results for input(s): AST, ALT, ALKPHOS, BILITOT, PROT, ALBUMIN in the last 168 hours. No results for input(s): LIPASE, AMYLASE in the last 168 hours. No results for input(s): AMMONIA in the last 168 hours. Coagulation Profile: No results for input(s): INR, PROTIME in the last 168 hours. Cardiac Enzymes: No results for input(s): CKTOTAL, CKMB, CKMBINDEX, TROPONINI in the last 168 hours. BNP (last 3 results) No results for input(s): PROBNP in the last 8760 hours. HbA1C: No results for input(s):  HGBA1C in the last 72 hours. CBG: No results for input(s): GLUCAP in the last 168 hours. Lipid Profile: No results for input(s): CHOL, HDL, LDLCALC, TRIG, CHOLHDL, LDLDIRECT in the last 72 hours. Thyroid Function Tests: No results for input(s): TSH, T4TOTAL, FREET4, T3FREE, THYROIDAB in the last 72 hours. Anemia Panel: No results for input(s): VITAMINB12, FOLATE, FERRITIN, TIBC, IRON, RETICCTPCT in the last 72 hours. Urine analysis:    Component Value Date/Time   COLORURINE YELLOW 01/13/2017 0005   APPEARANCEUR HAZY (A) 12/30/2016 0005   LABSPEC 1.020 12/26/2016 0005   PHURINE 7.0 12/29/2016 0005   GLUCOSEU NEGATIVE 12/22/2016 0005   HGBUR MODERATE (A) 01/14/2017 0005   BILIRUBINUR NEGATIVE 01/06/2017 0005   KETONESUR NEGATIVE 12/20/2016 0005   PROTEINUR 100 (A) 12/26/2016 0005   UROBILINOGEN 1.0 05/25/2014 1800   NITRITE POSITIVE (A) 12/16/2016 0005   LEUKOCYTESUR LARGE (A) 12/20/2016 0005    Radiological Exams on Admission: Ct Head Wo Contrast  Result Date: 12/27/2016 CLINICAL DATA:  83 year old female with altered mental status. EXAM:  CT HEAD WITHOUT CONTRAST TECHNIQUE: Contiguous axial images were obtained from the base of the skull through the vertex without intravenous contrast. COMPARISON:  None. FINDINGS: Brain: There is mild age-related atrophy and chronic microvascular ischemic changes. There is no acute intracranial hemorrhage. No mass effect or midline shift noted. No extra-axial fluid collection. There is prominence of the posterior fossa CSF space which may represent a mega cisterna magna versus an arachnoid cyst. MRI may provide better evaluation. Vascular: No hyperdense vessel or unexpected calcification. Skull: Normal. Negative for fracture or focal lesion. Sinuses/Orbits: No acute finding. Other: None IMPRESSION: 1. No acute intracranial hemorrhage. 2. Age-related atrophy and chronic microvascular ischemic changes. If symptoms persist, and there are no  contraindications, MRI may provide better evaluation if clinically indicated. 3. 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 Electronically Signed   By: Elgie Collard M.D.   On: 01-25-17 23:44    EKG: Independently reviewed. Vent. rate 56 BPM PR interval * ms QRS duration 113 ms QT/QTc 575/556 ms P-R-T axes 62 44 55 Sinus rhythm Prolonged PR interval Consider left atrial enlargement Prolonged QT interval Baseline wander in lead(s) V4 V5  Assessment/Plan Principal Problem:   CVA (cerebral vascular accident) [facility]) Initially admitted was strongly impression and CVA. However, the patient could also be altered due to UTI. However, given dysarthria Will proceed with stroke workup. Frequent neuro checks and swallow screen. Check fasting lipids and hemoglobin A1c. Check carotid Doppler and echocardiogram. Routine neurology consult later today.  Active Problems:   UTI (urinary tract infection) Rocephin 1 g IV PB every 24 hours. Follow-up urine culture and sensitivity. Adjust antibiotic therapy as needed.    Diabetes type 2, controlled (HCC) Carbohydrate modified diet. Check hemoglobin A1c.    Altered mental status Likely as result of neurological event or UTI. Continue treatment as above.    Pressure ulcer of buttock Consult wound/ostomy.    DVT prophylaxis: Lovenox SQ. Code Status: Full code. Family Communication: Her son and daughter-in-law were present in the ED room. Disposition Plan: Admit for stroke workup and IV antibiotics for 2-3 days. Consults called: Routine neurology consult. Admission status: Inpatient/telemetry.   Bobette Mo MD [facility] Pager [phone].  If 7PM-7AM, please contact night-coverage [email or web address] Password [email or web address]  01/12/2017, 2:36 AM

## 2017-01-12 NOTE — Progress Notes (Signed)
SLP Cancellation Note  Patient Details Name: [name] MRN: [id number] DOB: 1925-10-13   Cancelled treatment:       Reason Eval/Treat Not Completed: Patient not medically ready; Pt transferred from 300 to ICU due to sepsis and now on Bi-PAP. SLP unable to completed BSE or SLE at this time. SLP will follow for acute needs. Above to RN.  Thank you,  Havery MorosDabney Tamar Miano, CCC-SLP [phone]    [name] 01/12/2017, 12:59 PM

## 2017-01-12 NOTE — Consult Note (Signed)
WOC Nurse wound consult note Reason for Consult: Bilateral LEs, bilateral ischial tuberosity pressure injuries. Wounds are from sitting for prolonged periods. MASD, specifically IAD in the perineal area and medial thighs due to UI.  Paitent now with powered external female urinary collection device in place (PURWick).  I am assisted in my assessment today by Bedside RN M. Howerton and a Runner, broadcasting/film/videostudent RN. Wound type: Pressure, venous insufficiency (likely lymphedema) Pressure Injury POA: Yes Measurement: Right IT:  Unstageable Pressure Injury (full thickness):  3.5cm x 4.5cm with depth unable to be determined due to the presence of eschar (non-viable tissue). Scant serous exudate. Odor consistent with necrotic tissue.  Left IT:  3.5cm round area of involvement with some evidence of healing evident (scarring). Wounds in central area measure 1cm x 2cm and 1cm round, both have nonviable yellow slough obscuring wound bed, so I am unable to capture depth.  Scant to small amount of light yellow to serous exudate. Bilateral LEs are edematous with linear crevices, but no open wounds. Heels intact. Sacrum intact. Wound bed: As described above Drainage (amount, consistency, odor) As described above Periwound: Intact, dry. Dressing procedure/placement/frequency: I will today provide patient with a mattress replacement and bilateral pressure redistribution boots.  A pressure redistribution chair cushion will be provided for OOB use both while here and post discharge, regardless of disposition. Guidance for topical wound care is provided via the Orders for Nursing using an enzymatic debriding agent to the bilateral ischial tuberosity ulcers (Collagenase/Santyl) and for conservative care of the LEs.  WOC nursing team will not follow, but will remain available to this patient, the nursing and medical teams.  Please re-consult if needed. Thanks, Ladona MowLaurie Woodfin Kiss, MSN, RN, GNP, Hans EdenCWOCN, CWON-AP, FAAN  Pager# [phone]  [phone]

## 2017-01-12 NOTE — Progress Notes (Signed)
[facility]

## 2017-01-12 NOTE — Progress Notes (Signed)
PT Cancellation Note  Patient Details Name: [name] MRN: [id number] DOB: 1925/11/29   Cancelled Treatment:    Reason Eval/Treat Not Completed: Fatigue/lethargy limiting ability to participate;Other (comment)  Patient held for afternoon evaluation per RN secondary to patient fatigue. PT will attempt in PM after lunch.  Glyn AdeRachel Quinn 01/12/2017, 10:30 AM

## 2017-01-12 NOTE — Progress Notes (Signed)
Pharmacy Antibiotic Note  [name] is a 83 y.o. female admitted on 12/20/2016 with sepsis.  Pharmacy has been consulted for Vancomycin and Cefepime dosing.  Plan: Vancomycin 1000mg  loading dose, then 500mg  IV every 12 hours.  Goal trough 15-20 mcg/mL. Cefepime 2gm IV q24 F/u Cxs and clinical progress Monitor V/S, labs and levels as indicated  Height: 5\' 4"  (162.6 cm) Weight: 144 lb 1.6 oz (65.4 kg) IBW/kg (Calculated) : 54.7  Temp (24hrs), Avg:93.9 F (34.4 C), Min:87.1 F (30.6 C), Max:97.5 F (36.4 C)   Recent Labs Lab 12/23/2016 2231 01/12/17 0518  WBC 9.3  --   CREATININE 0.42* 0.36*    Estimated Creatinine Clearance: 41.2 mL/min (A) (by C-G formula based on SCr of 0.36 mg/dL (L)).    Allergies  Allergen Reactions  . Codeine Nausea And Vomiting  . Penicillins Nausea And Vomiting    Has patient had a PCN reaction causing immediate rash, facial/tongue/throat swelling, SOB or lightheadedness with hypotension: Yes Has patient had a PCN reaction causing severe rash involving mucus membranes or skin necrosis: No Has patient had a PCN reaction that required hospitalization: No Has patient had a PCN reaction occurring within the last 10 years: No If all of the above answers are "NO", then may proceed with Cephalosporin use.    Antimicrobials this admission: Vancomycin 10/29>>  Cefepime 10/29 >> Ceftriaxone 1gm and azithromycin 500mg  IV x 1 10/29   Dose adjustments this admission: N/A  Microbiology results: 10/29 BCx: pending 10/29 MRSA PCR: pending  Thank you for allowing pharmacy to be a part of this patient's care.  Elder CyphersLorie Azlyn Wingler, BS Loura Backharm D, [place]PS Clinical Pharmacist Pager [phone]  01/12/2017 12:24 PM

## 2017-01-12 NOTE — Progress Notes (Signed)
2017-01-17 10:17 PM  01/12/2017 10:58 AM  [name] was seen and examined.  The H&P by the admitting provider, orders, imaging was reviewed.  Please see new orders.  Will continue to follow.   [name]. [name], MD [facility]

## 2017-01-12 NOTE — Progress Notes (Signed)
**Note De-Identified  Obfuscation** Critical EKG results reported to RN

## 2017-01-13 ENCOUNTER — Inpatient Hospital Stay (HOSPITAL_COMMUNITY): Payer: Medicare Other

## 2017-01-13 DIAGNOSIS — L89300 Pressure ulcer of unspecified buttock, unstageable: Secondary | ICD-10-CM

## 2017-01-13 DIAGNOSIS — T68XXXD Hypothermia, subsequent encounter: Secondary | ICD-10-CM

## 2017-01-13 LAB — HEMOGLOBIN A1C
HEMOGLOBIN A1C: 5.7 % — AB (ref 4.8–5.6)
MEAN PLASMA GLUCOSE: 117 mg/dL

## 2017-01-13 LAB — CBC WITH DIFFERENTIAL/PLATELET
BASOS ABS: 0 10*3/uL (ref 0.0–0.1)
BASOS PCT: 0 %
EOS ABS: 0 10*3/uL (ref 0.0–0.7)
Eosinophils Relative: 0 %
HEMATOCRIT: 35.2 % — AB (ref 36.0–46.0)
Hemoglobin: 11.1 g/dL — ABNORMAL LOW (ref 12.0–15.0)
Lymphocytes Relative: 4 %
Lymphs Abs: 0.3 10*3/uL — ABNORMAL LOW (ref 0.7–4.0)
MCH: 30.1 pg (ref 26.0–34.0)
MCHC: 31.5 g/dL (ref 30.0–36.0)
MCV: 95.4 fL (ref 78.0–100.0)
MONO ABS: 0.5 10*3/uL (ref 0.1–1.0)
MONOS PCT: 6 %
NEUTROS ABS: 7.7 10*3/uL (ref 1.7–7.7)
Neutrophils Relative %: 90 %
PLATELETS: 171 10*3/uL (ref 150–400)
RBC: 3.69 MIL/uL — ABNORMAL LOW (ref 3.87–5.11)
RDW: 14.1 % (ref 11.5–15.5)
WBC: 8.5 10*3/uL (ref 4.0–10.5)

## 2017-01-13 LAB — COMPREHENSIVE METABOLIC PANEL
ALBUMIN: 2.5 g/dL — AB (ref 3.5–5.0)
ALT: 37 U/L (ref 14–54)
ANION GAP: 3 — AB (ref 5–15)
AST: 49 U/L — AB (ref 15–41)
Alkaline Phosphatase: 83 U/L (ref 38–126)
BUN: 7 mg/dL (ref 6–20)
CHLORIDE: 106 mmol/L (ref 101–111)
CO2: 26 mmol/L (ref 22–32)
Calcium: 8.2 mg/dL — ABNORMAL LOW (ref 8.9–10.3)
Creatinine, Ser: 0.4 mg/dL — ABNORMAL LOW (ref 0.44–1.00)
GFR calc Af Amer: >60 mL/min (ref >60)
GFR calc non Af Amer: >60 mL/min (ref >60)
GLUCOSE: 87 mg/dL (ref 65–99)
POTASSIUM: 3.7 mmol/L (ref 3.5–5.1)
SODIUM: 135 mmol/L (ref 135–145)
Total Bilirubin: 0.4 mg/dL (ref 0.3–1.2)
Total Protein: 6 g/dL — ABNORMAL LOW (ref 6.5–8.1)

## 2017-01-13 LAB — GLUCOSE, CAPILLARY
GLUCOSE-CAPILLARY: 60 mg/dL — AB (ref 65–99)
Glucose-Capillary: 139 mg/dL — ABNORMAL HIGH (ref 65–99)
Glucose-Capillary: 102 mg/dL — ABNORMAL HIGH (ref 65–99)
Glucose-Capillary: 95 mg/dL (ref 65–99)

## 2017-01-13 MED ORDER — ALBUTEROL SULFATE (2.5 MG/3ML) 0.083% IN NEBU
2.5000 mg | INHALATION_SOLUTION | RESPIRATORY_TRACT | Status: DC | PRN
  Administered 2017-01-19: 2.5 mg via RESPIRATORY_TRACT
  Filled 2017-01-13: qty 3

## 2017-01-13 MED ORDER — ACETAMINOPHEN 325 MG PO TABS: 325.0000 mg | ORAL_TABLET | Freq: Four times a day (QID) | ORAL | Status: DC | PRN

## 2017-01-13 MED ORDER — VANCOMYCIN HCL IN DEXTROSE 750-5 MG/150ML-% IV SOLN
750.0000 mg | Freq: Two times a day (BID) | INTRAVENOUS | Status: DC
  Administered 2017-01-13 – 2017-01-16 (×7): 750 mg via INTRAVENOUS
  Filled 2017-01-13 (×13): qty 150

## 2017-01-13 MED ORDER — HALOPERIDOL LACTATE 5 MG/ML IJ SOLN
0.5000 mg | Freq: Once | INTRAMUSCULAR | Status: AC
  Administered 2017-01-13: 0.5 mg via INTRAVENOUS
  Filled 2017-01-13: qty 1

## 2017-01-13 MED ORDER — FENTANYL CITRATE (PF) 100 MCG/2ML IJ SOLN
25.0000 ug | INTRAMUSCULAR | Status: DC | PRN
  Administered 2017-01-13 – 2017-01-19 (×22): 25 ug via INTRAVENOUS
  Filled 2017-01-13 (×22): qty 2

## 2017-01-13 MED ORDER — MORPHINE SULFATE (PF) 2 MG/ML IV SOLN: 0.5000 mg | INTRAVENOUS | Status: DC | PRN

## 2017-01-13 MED ORDER — IPRATROPIUM-ALBUTEROL 0.5-2.5 (3) MG/3ML IN SOLN
3.0000 mL | Freq: Four times a day (QID) | RESPIRATORY_TRACT | Status: DC
  Administered 2017-01-14 – 2017-01-16 (×9): 3 mL via RESPIRATORY_TRACT
  Filled 2017-01-13 (×10): qty 3

## 2017-01-13 MED ORDER — ACETAMINOPHEN 325 MG RE SUPP: 325.0000 mg | Freq: Four times a day (QID) | RECTAL | Status: DC | PRN

## 2017-01-13 MED ORDER — DEXTROSE-NACL 5-0.9 % IV SOLN
INTRAVENOUS | Status: DC
  Administered 2017-01-13 – 2017-01-19 (×9): via INTRAVENOUS

## 2017-01-13 MED ORDER — DEXTROSE 50 % IV SOLN
INTRAVENOUS | Status: AC
  Administered 2017-01-13: 50 mL
  Filled 2017-01-13: qty 50

## 2017-01-13 MED ORDER — FENTANYL CITRATE (PF) 100 MCG/2ML IJ SOLN
12.5000 ug | INTRAMUSCULAR | Status: DC | PRN
  Administered 2017-01-13: 12.5 ug via INTRAVENOUS
  Filled 2017-01-13: qty 2

## 2017-01-13 MED ORDER — HALOPERIDOL LACTATE 5 MG/ML IJ SOLN
1.0000 mg | Freq: Once | INTRAMUSCULAR | Status: AC
  Administered 2017-01-14: 1 mg via INTRAVENOUS
  Filled 2017-01-13: qty 1

## 2017-01-13 NOTE — Progress Notes (Signed)
Patient not needing BIPAP at this time. Unit at bedside.

## 2017-01-13 NOTE — Progress Notes (Signed)
**Note De-identified  Obfuscation** Patient removed from BIPAP and placed on 8 L HFNC; tolerating well .  RRT to continue to monitor 

## 2017-01-13 NOTE — Consult Note (Signed)
   [facility]   01/13/2017  Kathryn [name] SeenRuth J [name] 1925-11-30 [id number]   Patient is [facility] with [facility] for chronic disease management services.  Patient has been engaged by a [employer] Community Care Coordinator.  Our community based plan of care has focused on disease management and community resource support.  Patient will receive a post discharge transition of care call and will be evaluated for monthly home visits for assessments and disease process education.  Made Inpatient Case Manager aware that [facility] following. Of note, [facility] services does not replace or interfere with any services that are needed or arranged by inpatient case management or social work.  For additional questions or referrals please contact:  Winson Eichorn RN, BSN [facility]  Hospital Liaison  [phone]) Business Mobile [phone]) Toll free office

## 2017-01-13 NOTE — Plan of Care (Signed)
Problem: Self-Care: Goal: Ability to participate in self-care as condition permits will improve Outcome: Not Progressing Patient only responding to pain and unable to follow commands

## 2017-01-13 NOTE — Progress Notes (Signed)
OT Cancellation Note  Patient Details Name: [name] MRN: [id number] DOB: Feb 09, 1926   Cancelled Treatment:    Reason Eval/Treat Not Completed: Medical issues which prohibited therapy. Pt transferred to ICU from 300 yesterday, [facility] pt has had a change in medical status and will requiring a new OT order prior to evaluation. Please re-order OT services when pt is medically ready.    Ezra SitesLeslie Cayetano Mikita, OTR/L  [phone] 01/13/2017, 7:27 AM

## 2017-01-13 NOTE — Progress Notes (Signed)
Hypoglycemic Event  CBG: 60  Treatment: 1 tube instant glucose  Symptoms: None  Follow-up CBG: Time: 0815 CBG Result:139  Possible Reasons for Event: Inadequate meal intake  Comments/MD notified: 1 amp 50% dextrose administered. Dr. Laural BenesJohnson notified.     [facility]CE[facility]abeth Campos OrasonLuevano

## 2017-01-13 NOTE — Progress Notes (Addendum)
PROGRESS NOTE  [name]  ZOX:[id number]  DOB: 12/22/1925  DOA: 12/17/2016 PCP: Benita Stabile, MD   Brief Admission Hx: [name] is a 83 y.o. female with medical history significant of type 2 diabetes who was brought to the emergency department by her son stating that for the past 2 days she has been having "garbled speech", decreased mentation, decreased oral intake and confusion. The patient lives by herself and is wheelchair-bound, but her son, daughter-in-law and grandkids live close by and help her daily several times a day. Her son mentions that she has been a slowly declining for the past week or so and has been battling "a chest cold".  Pt found to have pneumonia, UTI, pressure ulcers and became hypothermic and hypotensive and admitted to ICU and started on levophed.    MDM/Assessment & Plan:   1. Acute CVA - Pt unresponsive.  Continue ICU care, continue supportive therapy, aspirin rectal suppositories, neuro checks.  Neurology has been consulted.  At this time, pt's son wants to continue full aggressive measures. Overall the prognosis is very poor and that was explained at length to him.   2. Hypothemia - suspect this is due to overwhelming sepsis, she is receiving warmed IV fluids and bair hugger and temperature is coming up but still not normal, continue aggressive care per son wishes.   3. Hypoglycemia - added dextrose to IVFs. Monitor BGs.  4. Acute respiratory failure with hypoxia - likely secondary to multifocal pneumonia, maintain on bipap.  5. UTI - continue IV antibiotics.  6. Multifocal pneumonia - continue broad spectrum antibiotics. Follow cultures.   7. Pressure ulcers on sacrum - Pt has full thickness, unstageable right IT pressure injury. Continue air mattress, bilateral pressure redistribution boots.  8. Sepsis - secondary to above infections, continue supportive therapy and IV broad spectrum antibiotics.  9. Metabolic/Septic Encephalopathy - suspect from CVA  and sepsis.  I will continue to have discussions with patient's son about pt's poor prognosis and low likelihood of any meaningful recovery.  She would benefit from hospice services at this time but he is declining.  10. Nutrition - dietitian consulted, if patient doesn't wake up soon, will need to place NG and start tube feeding tomorrow.   DVT prophylaxis: lovenox Code Status: full  Family Communication: son  Disposition Plan: pt is critically ill and mortality high with these illnesses, discussed with son and will continue to discuss with him about initiating comfort and dignity care.    Consultants:  neurology   Subjective: Pt unresponsive. Temp slowly climbing with warmed IV fluids and bair hugger  Objective: Vitals:   01/13/17 0645 01/13/17 0700 01/13/17 0730 01/13/17 0757  BP: 102/67 90/79 107/69   Pulse: 75 75 76 78  Resp: (!) 28 (!) 29 20 18   Temp:   (!) 92.8 F (33.8 C)   TempSrc:      SpO2: 98% 98% 99% 98%  Weight:      Height:        Intake/Output Summary (Last 24 hours) at 01/13/17 0833 Last data filed at 01/13/17 0500  Gross per 24 hour  Intake           1735.7 ml  Output              400 ml  Net           1335.7 ml   Filed Weights   12/17/2016 2227 01/12/17 0230  Weight: 64.6 kg (142 lb 6.7 oz)  65.4 kg (144 lb 1.6 oz)     REVIEW OF SYSTEMS  As per history otherwise all reviewed and reported negative  Exam:  General exam: emaciated, chronically ill appearing female, on bipap Respiratory system: shallow breath sounds on bipap.  Cardiovascular system: S1 & S2 heard.  Gastrointestinal system: Abdomen is nondistended, soft. Normal bowel sounds heard. Central nervous system: obtunded.  Extremities: chronic skin changes, bilateral unstageable pressure ulcers  Data Reviewed: Basic Metabolic Panel:  Recent Labs Lab 01-16-2017 2231 01/12/17 0518 01/12/17 1435 01/13/17 0524  NA 139 137 137 135  K 4.0 3.7 3.6 3.7  CL 103 103 105 106  CO2 28 29 25 26    GLUCOSE 118* 106* 125* 87  BUN 9 8 7 7   CREATININE 0.42* 0.36* 0.36* 0.40*  CALCIUM 9.6 8.9 8.4* 8.2*   Liver Function Tests:  Recent Labs Lab 01/12/17 0518 01/12/17 1435 01/13/17 0524  AST 35 48* 49*  ALT 25 34 37  ALKPHOS 78 84 83  BILITOT 0.5 0.6 0.4  PROT 6.4* 6.0* 6.0*  ALBUMIN 2.8* 2.7* 2.5*   No results for input(s): LIPASE, AMYLASE in the last 168 hours. No results for input(s): AMMONIA in the last 168 hours. CBC:  Recent Labs Lab 01-16-2017 2231 01/12/17 1435 01/13/17 0524  WBC 9.3 7.0 8.5  NEUTROABS 8.4*  --  7.7  HGB 11.9* 11.0* 11.1*  HCT 36.7 34.9* 35.2*  MCV 94.3 95.1 95.4  PLT 181 160 171   Cardiac Enzymes:  Recent Labs Lab 01/12/17 1435  TROPONINI <0.03   CBG (last 3)   Recent Labs  01/12/17 2059 01/13/17 0726 01/13/17 0819  GLUCAP 112* 60* 139*   Recent Results (from the past 240 hour(s))  MRSA PCR Screening     Status: None   Collection Time: 01/12/17 12:50 PM  Result Value Ref Range Status   MRSA by PCR NEGATIVE NEGATIVE Final    Comment:        The GeneXpert MRSA Assay (FDA approved for NASAL specimens only), is one component of a comprehensive MRSA colonization surveillance program. It is not intended to diagnose MRSA infection nor to guide or monitor treatment for MRSA infections.   Culture, blood (x 2)     Status: None (Preliminary result)   Collection Time: 01/12/17  2:35 PM  Result Value Ref Range Status   Specimen Description LEFT ANTECUBITAL  Final   Special Requests   Final    BOTTLES DRAWN AEROBIC AND ANAEROBIC Blood Culture results may not be optimal due to an excessive volume of blood received in culture bottles   Culture NO GROWTH < 24 HOURS  Final   Report Status PENDING  Incomplete  Culture, blood (x 2)     Status: None (Preliminary result)   Collection Time: 01/12/17  2:35 PM  Result Value Ref Range Status   Specimen Description BLOOD LEFT WRIST  Final   Special Requests   Final    BOTTLES DRAWN AEROBIC  ONLY Blood Culture adequate volume   Culture NO GROWTH < 24 HOURS  Final   Report Status PENDING  Incomplete     Studies: Dg Chest 1 View  Result Date: 01/12/2017 CLINICAL DATA:  Dysarthria. EXAM: CHEST 1 VIEW COMPARISON:  Radiograph of May 28, 2014. FINDINGS: Mild cardiomegaly is noted. No pneumothorax or pleural effusion is noted. Severe degenerative change is seen involving both glenohumeral joints. Probable multifocal opacities are noted in the right lower lobe which may represent possible pneumonia. Stable elevated right hemidiaphragm is  noted. IMPRESSION: Possible multifocal pneumonia in right lung base. Followup PA and lateral chest X-ray is recommended in 3-4 weeks following trial of antibiotic therapy to ensure resolution and exclude underlying malignancy. Electronically Signed   By: Lupita Raider, M.D.   On: 01/12/2017 09:00   Ct Head Wo Contrast  Result Date: 02/05/2017 CLINICAL DATA:  83 year old female with altered mental status. EXAM: CT HEAD WITHOUT CONTRAST TECHNIQUE: Contiguous axial images were obtained from the base of the skull through the vertex without intravenous contrast. COMPARISON:  None. FINDINGS: Brain: There is mild age-related atrophy and chronic microvascular ischemic changes. There is no acute intracranial hemorrhage. No mass effect or midline shift noted. No extra-axial fluid collection. There is prominence of the posterior fossa CSF space which may represent a mega cisterna magna versus an arachnoid cyst. MRI may provide better evaluation. Vascular: No hyperdense vessel or unexpected calcification. Skull: Normal. Negative for fracture or focal lesion. Sinuses/Orbits: No acute finding. Other: None IMPRESSION: 1. No acute intracranial hemorrhage. 2. Age-related atrophy and chronic microvascular ischemic changes. If symptoms persist, and there are no contraindications, MRI may provide better evaluation if clinically indicated. 3. 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 Electronically Signed   By: Elgie Collard M.D.   On: 01/21/2017 23:44   Mr Brain Wo Contrast  Result Date: 01/12/2017 CLINICAL DATA:  Garbled speech for 2 days. Only able to state name. Memory loss. Diabetes mellitus. EXAM: MRI HEAD WITHOUT CONTRAST MRA HEAD WITHOUT CONTRAST TECHNIQUE: Multiplanar, multiecho pulse sequences of the brain and surrounding structures were obtained without intravenous contrast. Angiographic images of the head were obtained using MRA technique without contrast. COMPARISON:  CT head 02/09/2017. FINDINGS: There is significant motion degradation. Study is of marginal diagnostic utility. MRI HEAD FINDINGS Brain: DWI axial series was repeated, and on the second set, there are two foci of restricted diffusion which possibly represent acute LEFT frontal cortical infarction, see image 91 series 24. I am unable to confidently identify these foci on the first axial diffusion sequence nor are they seen on the only coronal diffusion sequence. Advanced atrophy with chronic microvascular ischemic change. No definite hemorrhage, mass lesion, hydrocephalus, or extra-axial fluid. Vascular: Flow voids are maintained. Skull and upper cervical spine: Mega cisterna magna. Marrow signal grossly unremarkable. Pannus appears to surround the odontoid. Sinuses/Orbits: No layering sinus fluid.  No orbital findings. Other: None. MRA HEAD FINDINGS Gross patency of the internal carotid arteries, basilar artery, and both vertebrals is established. Suspected BILATERAL 50% supraclinoid stenoses of both internal carotid arteries. Due to tortuosity and motion, there is artifactual signal loss in the LEFT M1 MCA. RIGHT M1 MCA is patent as are both anterior and posterior cerebral arteries. Bilobed M2 branches appear patent. Cerebellar branches poorly visualized. No definite saccular aneurysm. IMPRESSION: Motion degraded exam demonstrate possible subcentimeter foci of acute  nonhemorrhagic infarction in the LEFT frontal lobe. See discussion above. Advanced atrophy and small vessel disease. Major intracranial vessels are patent on MRA, without definite flow-limiting stenosis. Electronically Signed   By: Elsie Stain M.D.   On: 01/12/2017 09:31   US Carotid Bilateral (at [facility] And [facility] Only)  Result Date: 01/12/2017 CLINICAL DATA:  Cerebral infarction. EXAM: BILATERAL CAROTID DUPLEX ULTRASOUND TECHNIQUE: Wallace Cullens scale imaging, color Doppler and duplex ultrasound were performed of bilateral carotid and vertebral arteries in the neck. COMPARISON:  None. FINDINGS: Criteria: Quantification of carotid stenosis is based on velocity parameters that correlate the residual internal carotid diameter with NASCET-based  stenosis levels, using the diameter of the distal internal carotid lumen as the denominator for stenosis measurement. The following velocity measurements were obtained: RIGHT ICA:  68/12 cm/sec CCA:  79/13 cm/sec SYSTOLIC ICA/CCA RATIO:  0.9 DIASTOLIC ICA/CCA RATIO:  0.9 ECA:  87 cm/sec LEFT ICA:  51/9 cm/sec CCA:  69/12 cm/sec SYSTOLIC ICA/CCA RATIO:  0.8 DIASTOLIC ICA/CCA RATIO:  0.7 ECA:  72 cm/sec RIGHT CAROTID ARTERY: Mild amount of partially calcified plaque is present at the level of the carotid bulb and proximal right ICA. Velocities and waveforms are normal and estimated right ICA stenosis is less than 50%. RIGHT VERTEBRAL ARTERY: Antegrade flow with normal waveform and velocity. LEFT CAROTID ARTERY: Mild amount of calcified plaque is present at the level of the carotid bulb and proximal left ICA. Plaque volume slightly greater on the left than the right. Estimated left ICA stenosis is less than 50%. LEFT VERTEBRAL ARTERY: Antegrade flow with normal waveform and velocity. IMPRESSION: Mild amount of calcified plaque at the level of both carotid bulbs and proximal internal carotid arteries, left greater than right. Estimated bilateral ICA stenoses are less than 50%.  Electronically Signed   By: Irish Lack M.D.   On: 01/12/2017 10:58   Mr Maxine Glenn Head/brain ZO Cm  Result Date: 01/12/2017 CLINICAL DATA:  Garbled speech for 2 days. Only able to state name. Memory loss. Diabetes mellitus. EXAM: MRI HEAD WITHOUT CONTRAST MRA HEAD WITHOUT CONTRAST TECHNIQUE: Multiplanar, multiecho pulse sequences of the brain and surrounding structures were obtained without intravenous contrast. Angiographic images of the head were obtained using MRA technique without contrast. COMPARISON:  CT head 01/09/2017. FINDINGS: There is significant motion degradation. Study is of marginal diagnostic utility. MRI HEAD FINDINGS Brain: DWI axial series was repeated, and on the second set, there are two foci of restricted diffusion which possibly represent acute LEFT frontal cortical infarction, see image 91 series 24. I am unable to confidently identify these foci on the first axial diffusion sequence nor are they seen on the only coronal diffusion sequence. Advanced atrophy with chronic microvascular ischemic change. No definite hemorrhage, mass lesion, hydrocephalus, or extra-axial fluid. Vascular: Flow voids are maintained. Skull and upper cervical spine: Mega cisterna magna. Marrow signal grossly unremarkable. Pannus appears to surround the odontoid. Sinuses/Orbits: No layering sinus fluid.  No orbital findings. Other: None. MRA HEAD FINDINGS Gross patency of the internal carotid arteries, basilar artery, and both vertebrals is established. Suspected BILATERAL 50% supraclinoid stenoses of both internal carotid arteries. Due to tortuosity and motion, there is artifactual signal loss in the LEFT M1 MCA. RIGHT M1 MCA is patent as are both anterior and posterior cerebral arteries. Bilobed M2 branches appear patent. Cerebellar branches poorly visualized. No definite saccular aneurysm. IMPRESSION: Motion degraded exam demonstrate possible subcentimeter foci of acute nonhemorrhagic infarction in the LEFT  frontal lobe. See discussion above. Advanced atrophy and small vessel disease. Major intracranial vessels are patent on MRA, without definite flow-limiting stenosis. Electronically Signed   By: Elsie Stain M.D.   On: 01/12/2017 09:31     Scheduled Meds: .  stroke: mapping our early stages of recovery book   Does not apply Once  . aspirin  324 mg Oral Once  . aspirin  300 mg Rectal Daily   Or  . aspirin  325 mg Oral Daily  . chlorhexidine  15 mL Mouth Rinse BID  . collagenase   Topical Daily  . enoxaparin (LOVENOX) injection  40 mg Subcutaneous Q24H  . hydrocerin   Topical  BID  . Influenza vac split quadrivalent PF  0.5 mL Intramuscular Tomorrow-1000  . ipratropium-albuterol  3 mL Nebulization Q4H  . mouth rinse  15 mL Mouth Rinse q12n4p  . pneumococcal 23 valent vaccine  0.5 mL Intramuscular Tomorrow-1000   Continuous Infusions: . ceFEPime (MAXIPIME) IV Stopped (01/12/17 1330)  . dextrose 5 % and 0.9% NaCl 75 mL/hr at 01/13/17 0830  . norepinephrine (LEVOPHED) Adult infusion 5 mcg/min (01/13/17 0400)  . vancomycin      Principal Problem:   CVA (cerebral vascular accident) (HCC) Active Problems:   UTI (urinary tract infection)   Pressure ulcer of buttock   Sepsis (HCC)   Diabetes type 2, controlled (HCC)   Altered mental status   Hypothermia   Multifocal pneumonia  Critical Care Time spent: 33 mins  Standley Dakinslanford Johnson, MD, FAAFP [facility] Pager [phone]  If 7PM-7AM, please contact night-coverage [email or web address] Password [email or web address] 01/13/2017, 8:33 AM    LOS: 1 day

## 2017-01-13 NOTE — Progress Notes (Signed)
Pharmacy Antibiotic Note  [name] is a 83 y.o. female admitted on 02-02-2017 with sepsis.  Pharmacy has been consulted for Vancomycin and Cefepime dosing.  Plan: Vancomycin 750mg  IV every 12 hours.  Goal trough 15-20 mcg/mL. Cefepime 2gm IV q24 F/u Cxs and clinical progress Monitor V/S, labs and levels as indicated  Height: 5\' 4"  (162.6 cm) Weight: 144 lb 1.6 oz (65.4 kg) IBW/kg (Calculated) : 54.7  Temp (24hrs), Avg:90 F (32.2 C), Min:86.7 F (30.4 C), Max:97.5 F (36.4 C)   Recent Labs Lab 2016/10/31 2231 01/12/17 0518 01/12/17 1158 01/12/17 1435 01/13/17 0524  WBC 9.3  --   --  7.0 8.5  CREATININE 0.42* 0.36*  --  0.36* 0.40*  LATICACIDVEN  --   --  0.9 0.9  --     Estimated Creatinine Clearance: 41.2 mL/min (A) (by C-G formula based on SCr of 0.4 mg/dL (L)).    Allergies  Allergen Reactions  . Codeine Nausea And Vomiting  . Penicillins Nausea And Vomiting    Has patient had a PCN reaction causing immediate rash, facial/tongue/throat swelling, SOB or lightheadedness with hypotension: Yes Has patient had a PCN reaction causing severe rash involving mucus membranes or skin necrosis: No Has patient had a PCN reaction that required hospitalization: No Has patient had a PCN reaction occurring within the last 10 years: No If all of the above answers are "NO", then may proceed with Cephalosporin use.   Antimicrobials this admission: Vancomycin 10/29>>  Cefepime 10/29 >> Ceftriaxone 1gm and azithromycin 500mg  IV x 1 10/29   Dose adjustments this admission: N/A  Microbiology results: 10/29 BCx: pending 10/29 MRSA PCR: pending  Thank you for allowing pharmacy to be a part of this patient's care.   Valrie HartScott Phyllis Whitefield, PharmD Clinical Pharmacist Pager:  [phone] 01/13/2017

## 2017-01-13 NOTE — Progress Notes (Signed)
Initial Nutrition Assessment  DOCUMENTATION CODES:      INTERVENTION:  If pt becomes alert enough to safely take po: Start Ensure Enlive po BID, each supplement provides 350 kcal and 20 grams of protein   Recommend liberalize diet given pt documented poor prognosis and advanced age   NUTRITION DIAGNOSIS:   Increased nutrient needs related to inability to eat, wound healing (acute CVA, sepsis (UTI and PNA)) as evidenced by estimated needs, NPO status, edema.   GOAL:   Patient will meet greater than or equal to 90% of their needs  MONITOR:   Diet advancement, PO intake, Skin  REASON FOR ASSESSMENT:   Malnutrition Screening Tool    ASSESSMENT: Patient is an 83 yo who presents with acute CVA, unresponsive and septic.   She found to have a UTI and Pneumonia. The son is at bedside and patient is wrapped from head to toe due to hypothermia. She has been unable to take safely take anything by mouth and remains NPO. During RD visit pt opened eyes for a second but did not respond to her name. Her weight hx is stable and usual weight is 64-65 kg range. Her son says the pt intake started declining last Friday and she only ate bites Fri to Sunday. Prior to acute illness and CVA she was able to feed herself a regular diet and beverages of choice are mostly coffee and regular soft drinks.    Recent Labs Lab 01/12/17 0518 01/12/17 1435 01/13/17 0524  NA 137 137 135  K 3.7 3.6 3.7  CL 103 105 106  CO2 29 25 26   BUN 8 7 7   CREATININE 0.36* 0.36* 0.40*  CALCIUM 8.9 8.4* 8.2*  GLUCOSE 106* 125* 87  labs and meds reviewed.    NUTRITION - FOCUSED PHYSICAL EXAM: unable to complete at this time  Diet Order:  Diet heart healthy/carb modified Room service appropriate? Yes; Fluid consistency: Thin  EDUCATION NEEDS:  None identified  No education needs have been identified at this time Skin:  Skin Assessment: Reviewed RN Assessment (unstageable and Stage III pressure injuries)   Last  BM:  unknown- none documented  Height:   Ht Readings from Last 1 Encounters:  01/12/17 5\' 4"  (1.626 m)    Weight:   Wt Readings from Last 1 Encounters:  01/12/17 144 lb 1.6 oz (65.4 kg)    Ideal Body Weight:  55 kg  BMI:  Body mass index is 24.73 kg/m.  Estimated Nutritional Needs:   Kcal:  1500-1600  Protein:  78-85 gr  Fluid:  1.5-1.6 liters daily    Royann ShiversLynn Yevette Knust MS,RD,CSG,LDN Office: [phone] Pager: [phone]

## 2017-01-13 NOTE — Progress Notes (Signed)
SLP Cancellation Note  Patient Details Name: [name] MRN: [id number] DOB: [birth year]   Cancelled treatment:       Reason Eval/Treat Not Completed: Patient not medically ready; Pt continues to require Bi-PAP and is somnolent. SLP will sign off at this time. Please re-consult if/when appropriate. Above to RN.   Thank you,  Havery MorosDabney Analissa Bayless, CCC-SLP [phone]    [name] 01/13/2017, 2:22 PM

## 2017-01-14 ENCOUNTER — Other Ambulatory Visit: Payer: Self-pay | Admitting: *Deleted

## 2017-01-14 ENCOUNTER — Inpatient Hospital Stay (HOSPITAL_COMMUNITY): Payer: Medicare Other

## 2017-01-14 DIAGNOSIS — R400 Somnolence: Secondary | ICD-10-CM

## 2017-01-14 DIAGNOSIS — R6521 Severe sepsis with septic shock: Secondary | ICD-10-CM

## 2017-01-14 DIAGNOSIS — J189 Pneumonia, unspecified organism: Secondary | ICD-10-CM

## 2017-01-14 DIAGNOSIS — I6302 Cerebral infarction due to thrombosis of basilar artery: Secondary | ICD-10-CM

## 2017-01-14 DIAGNOSIS — A419 Sepsis, unspecified organism: Secondary | ICD-10-CM

## 2017-01-14 LAB — BASIC METABOLIC PANEL
ANION GAP: 4 — AB (ref 5–15)
BUN: 7 mg/dL (ref 6–20)
CALCIUM: 8 mg/dL — AB (ref 8.9–10.3)
CO2: 28 mmol/L (ref 22–32)
Chloride: 108 mmol/L (ref 101–111)
Creatinine, Ser: 0.57 mg/dL (ref 0.44–1.00)
GFR calc Af Amer: >60 mL/min (ref >60)
Glucose, Bld: 92 mg/dL (ref 65–99)
POTASSIUM: 3.7 mmol/L (ref 3.5–5.1)
SODIUM: 140 mmol/L (ref 135–145)

## 2017-01-14 LAB — GLUCOSE, CAPILLARY
GLUCOSE-CAPILLARY: 88 mg/dL (ref 65–99)
GLUCOSE-CAPILLARY: 93 mg/dL (ref 65–99)
Glucose-Capillary: 85 mg/dL (ref 65–99)
Glucose-Capillary: 92 mg/dL (ref 65–99)
Glucose-Capillary: 86 mg/dL (ref 65–99)

## 2017-01-14 MED ORDER — LORAZEPAM 2 MG/ML IJ SOLN
0.5000 mg | Freq: Once | INTRAMUSCULAR | Status: AC
  Administered 2017-01-14: 0.5 mg via INTRAVENOUS
  Filled 2017-01-14: qty 1

## 2017-01-14 MED ORDER — HALOPERIDOL LACTATE 5 MG/ML IJ SOLN: 1.0000 mg | Freq: Once | INTRAMUSCULAR | Status: DC

## 2017-01-14 NOTE — Progress Notes (Signed)
Patient on heated high flow nasal cannula and resting comfortably. Bipap still in room, but not in use.

## 2017-01-14 NOTE — Patient Outreach (Signed)
[facility] Care Management  01/14/2017  [name] 12-19-1925 [id number]   CSW made a third and final attempt to try and contact patient today to perform phone assessment as well as assess and assist with social work needs and services, without success. A HIPPA compliant message was left for patient on voicemail. CSW continues to await return call. CSW had CMA mail an outreach letter to patient's home, encouraging patient to contact CSW at their earliest convenience, if patient is interested in continuing to receive social work services through CSW with [facility]. If CSW does not receive a return call from patient within the next 10 business days, CSW will proceed with case closure. Required number of phone attempts will have been made and outreach letter mailed.    Lincoln MaxinKelly Alexxis Mackert, LCSW [facility]  Clinical Social Worker cell #: [phone]

## 2017-01-14 NOTE — Progress Notes (Signed)
PROGRESS NOTE    [name]  WUJ:[id number] DOB: 14-Oct-1925 DOA: 12/19/2016 PCP: Benita Stabile, MD     Brief Narrative:  Patient is an 83 year old woman admitted from home on 10/28 due to garbled speech, decreased mentation, decreased oral intake and confusion.  The patient at baseline lives alone and is wheelchair-bound but son visits frequently.  Patient was found to have a UTI, possibly CVA, possibly pneumonia with signs of sepsis and admission requested.  DNR as of 10/31.   Assessment & Plan:   Principal Problem:   CVA (cerebral vascular accident) (HCC) Active Problems:   Diabetes type 2, controlled (HCC)   UTI (urinary tract infection)   Altered mental status   Pressure ulcer of buttock   Sepsis (HCC)   Hypothermia   Multifocal pneumonia   Septic shock -Still requiring pressor support, on norepinephrine. -Source is urine, plus minus pneumonia.  Urosepsis -Unfortunately urine culture not requested on admission, blood cultures remain negative.  Is on broad-spectrum antibiotic therapy with vancomycin and cefepime, given severity of illness I believe it is appropriate to continue broad-spectrum antibiotics at this time. -Can consider discontinuation of vancomycin over the next 24 hours if pressor dependency improves given the fact that she was MRSA PCR negative.  Community-acquired pneumonia -This diagnosis is not entirely clear, initial chest x-ray with may be a basilar infiltrate but subsequent x-rays look more like pulmonary edema. -Nonetheless remains on broad-spectrum antibiotic therapy and culture data has been requested.  Acute hypoxemic respiratory failure -Patient is requiring high amount of oxygen, believe she has pulmonary edema due to aggressive fluid resuscitation given sepsis.  However her pressor dependent hypotension limits use of diuretic therapy at this time.  Sacral/heel pressure ulcers -Present prior to admission, continue air mattress, pressure  boots  Metabolic/septic encephalopathy -Due to ongoing infections, would hope for improvement, however given acute CVA will need to see how much she can improve.  Acute CVA -As per MRI.  Continue supportive therapy, rectal aspirin.   DVT prophylaxis: Lovenox Code Status: DNR Family Communication: Son at bedside updated on plan of care and all questions answered Disposition Plan: Prolonged discussion with son this a.m. totaling approximately 50 minutes, we have decided to make her a DNR, continue other aggressive treatments over the next 48 hours however if no significant improvement by then will consider withdrawal of care and hospice.  Consultants:   None  Procedures:   None  Antimicrobials:  Anti-infectives    Start     Dose/Rate Route Frequency Ordered Stop   01/13/17 1100  azithromycin (ZITHROMAX) 250 mg in dextrose 5 % 125 mL IVPB  Status:  Discontinued     250 mg 125 mL/hr over 60 Minutes Intravenous  Once 01/12/17 1051 01/12/17 1102   01/13/17 1100  azithromycin (ZITHROMAX) 250 mg in dextrose 5 % 125 mL IVPB  Status:  Discontinued     250 mg 125 mL/hr over 60 Minutes Intravenous Every 24 hours 01/12/17 1102 01/12/17 1214   01/13/17 1000  vancomycin (VANCOCIN) IVPB 750 mg/150 ml premix     750 mg 150 mL/hr over 60 Minutes Intravenous Every 12 hours 01/13/17 0744     01/12/17 1300  ceFEPIme (MAXIPIME) 2 g in dextrose 5 % 50 mL IVPB     2 g 100 mL/hr over 30 Minutes Intravenous Every 24 hours 01/12/17 1246     01/12/17 1230  levofloxacin (LEVAQUIN) IVPB 750 mg  Status:  Discontinued     750 mg 100 mL/hr over  90 Minutes Intravenous  Once 01/12/17 1216 01/12/17 1242   01/12/17 1230  aztreonam (AZACTAM) 2 g in dextrose 5 % 50 mL IVPB  Status:  Discontinued     2 g 100 mL/hr over 30 Minutes Intravenous  Once 01/12/17 1216 01/12/17 1242   01/12/17 1230  vancomycin (VANCOCIN) IVPB 1000 mg/200 mL premix     1,000 mg 200 mL/hr over 60 Minutes Intravenous  Once 01/12/17 1216  01/12/17 1630   01/12/17 1100  azithromycin (ZITHROMAX) 500 mg in dextrose 5 % 250 mL IVPB  Status:  Discontinued     500 mg 250 mL/hr over 60 Minutes Intravenous  Once 01/12/17 1051 01/12/17 1253   01/12/17 0115  cefTRIAXone (ROCEPHIN) 1 g in dextrose 5 % 50 mL IVPB  Status:  Discontinued     1 g 100 mL/hr over 30 Minutes Intravenous Every 24 hours 01/12/17 0109 01/12/17 1214       Subjective: Unresponsive  Objective: Vitals:   01/14/17 1530 01/14/17 1545 01/14/17 1549 01/14/17 1600  BP: 138/67 131/66  124/66  Pulse: 96 95  95  Resp: 14 15  (!) 27  Temp:      TempSrc:      SpO2: 95% 93% 96% 90%  Weight:      Height:        Intake/Output Summary (Last 24 hours) at 01/14/17 1744 Last data filed at 01/14/17 1500  Gross per 24 hour  Intake           2213.6 ml  Output              600 ml  Net           1613.6 ml   Filed Weights   02-Feb-2017 2227 01/12/17 0230  Weight: 64.6 kg (142 lb 6.7 oz) 65.4 kg (144 lb 1.6 oz)    Examination:  General exam: Unresponsive Respiratory system: Increased work of breathing, coarse bilateral breath sounds, positive rhonchi, no wheezing Cardiovascular system:RRR. No murmurs, rubs, gallops. Gastrointestinal system: Abdomen is nondistended, soft and nontender. No organomegaly or masses felt. Normal bowel sounds heard. Central nervous system: Unable to fully assess given current mental state Extremities: Bilateral heel boots and wrappings Psychiatry: Unable to assess given current mental state    Data Reviewed: I have personally reviewed following labs and imaging studies  CBC:  Recent Labs Lab 2017/02/02 2231 01/12/17 1435 01/13/17 0524  WBC 9.3 7.0 8.5  NEUTROABS 8.4*  --  7.7  HGB 11.9* 11.0* 11.1*  HCT 36.7 34.9* 35.2*  MCV 94.3 95.1 95.4  PLT 181 160 171   Basic Metabolic Panel:  Recent Labs Lab 02-02-2017 2231 01/12/17 0518 01/12/17 1435 01/13/17 0524 01/14/17 0518  NA 139 137 137 135 140  K 4.0 3.7 3.6 3.7 3.7  CL  103 103 105 106 108  CO2 28 29 25 26 28   GLUCOSE 118* 106* 125* 87 92  BUN 9 8 7 7 7   CREATININE 0.42* 0.36* 0.36* 0.40* 0.57  CALCIUM 9.6 8.9 8.4* 8.2* 8.0*   GFR: Estimated Creatinine Clearance: 41.2 mL/min (by C-G formula based on SCr of 0.57 mg/dL). Liver Function Tests:  Recent Labs Lab 01/12/17 0518 01/12/17 1435 01/13/17 0524  AST 35 48* 49*  ALT 25 34 37  ALKPHOS 78 84 83  BILITOT 0.5 0.6 0.4  PROT 6.4* 6.0* 6.0*  ALBUMIN 2.8* 2.7* 2.5*   No results for input(s): LIPASE, AMYLASE in the last 168 hours. No results for input(s): AMMONIA in the  last 168 hours. Coagulation Profile:  Recent Labs Lab 01/12/17 1435  INR 1.30   Cardiac Enzymes:  Recent Labs Lab 01/12/17 1435  TROPONINI <0.03   BNP (last 3 results) No results for input(s): PROBNP in the last 8760 hours. HbA1C:  Recent Labs  01/12/17 0517  HGBA1C 5.7*   CBG:  Recent Labs Lab 01/13/17 1614 01/13/17 2241 01/14/17 0837 01/14/17 1134 01/14/17 1602  GLUCAP 95 88 85 93 92   Lipid Profile:  Recent Labs  01/12/17 0518  CHOL 153  HDL 48  LDLCALC 95  TRIG 49  CHOLHDL 3.2   Thyroid Function Tests:  Recent Labs  01/12/17 0518  TSH 9.641*   Anemia Panel:  Recent Labs  01/12/17 0518  VITAMINB12 1,240*  FOLATE 4.8*  FERRITIN 307  TIBC 252  IRON 32  RETICCTPCT 1.6   Urine analysis:    Component Value Date/Time   COLORURINE YELLOW 01/08/2017 0005   APPEARANCEUR HAZY (A) 01/05/2017 0005   LABSPEC 1.020 01/04/2017 0005   PHURINE 7.0 01/03/2017 0005   GLUCOSEU NEGATIVE 01/05/2017 0005   HGBUR MODERATE (A) 01/10/2017 0005   BILIRUBINUR NEGATIVE 01/06/2017 0005   KETONESUR NEGATIVE 12/17/2016 0005   PROTEINUR 100 (A) 01/03/2017 0005   UROBILINOGEN 1.0 05/25/2014 1800   NITRITE POSITIVE (A) 12/31/2016 0005   LEUKOCYTESUR LARGE (A) 01/07/2017 0005   Sepsis Labs: @LABRCNTIP (procalcitonin:4,lacticidven:4)  ) Recent Results (from the past 240 hour(s))  MRSA PCR Screening      Status: None   Collection Time: 01/12/17 12:50 PM  Result Value Ref Range Status   MRSA by PCR NEGATIVE NEGATIVE Final    Comment:        The GeneXpert MRSA Assay (FDA approved for NASAL specimens only), is one component of a comprehensive MRSA colonization surveillance program. It is not intended to diagnose MRSA infection nor to guide or monitor treatment for MRSA infections.   Culture, blood (x 2)     Status: None (Preliminary result)   Collection Time: 01/12/17  2:35 PM  Result Value Ref Range Status   Specimen Description LEFT ANTECUBITAL  Final   Special Requests   Final    BOTTLES DRAWN AEROBIC AND ANAEROBIC Blood Culture results may not be optimal due to an excessive volume of blood received in culture bottles   Culture NO GROWTH 2 DAYS  Final   Report Status PENDING  Incomplete  Culture, blood (x 2)     Status: None (Preliminary result)   Collection Time: 01/12/17  2:35 PM  Result Value Ref Range Status   Specimen Description BLOOD LEFT WRIST  Final   Special Requests   Final    BOTTLES DRAWN AEROBIC ONLY Blood Culture adequate volume   Culture NO GROWTH 2 DAYS  Final   Report Status PENDING  Incomplete         Radiology Studies: Dg Chest Port 1 View  Result Date: 01/14/2017 CLINICAL DATA:  Sepsis/AMS/diabetic Unable to move warming blanket due to pt's mental status-nurse just got pt settled from no rest yesterday and all night EXAM: PORTABLE CHEST 1 VIEW COMPARISON:  01/13/2017 FINDINGS: External artifact noted. Stable cardiac silhouette. There is increase in pulmonary edema pattern within LEFT RIGHT lung. Low lung volumes. PICC line unchanged IMPRESSION: Increasing pulmonary edema. Electronically Signed   By: Genevive BiStewart  Edmunds M.D.   On: 01/14/2017 07:41   Dg Chest Port 1 View  Result Date: 01/13/2017 CLINICAL DATA:  Sepsis EXAM: PORTABLE CHEST 1 VIEW COMPARISON:  01/12/2017 FINDINGS: Right PICC  line tip is in the SVC. Low lung volumes. Bilateral airspace  opacities likely worsening since prior study. Heart is borderline in size. Small layering effusions. IMPRESSION: Worsening bilateral airspace disease and small bilateral effusions. Findings likely reflect worsening infection/ CHF. Electronically Signed   By: Charlett Nose M.D.   On: 01/13/2017 08:45        Scheduled Meds: .  stroke: mapping our early stages of recovery book   Does not apply Once  . aspirin  300 mg Rectal Daily   Or  . aspirin  325 mg Oral Daily  . chlorhexidine  15 mL Mouth Rinse BID  . collagenase   Topical Daily  . enoxaparin (LOVENOX) injection  40 mg Subcutaneous Q24H  . hydrocerin   Topical BID  . Influenza vac split quadrivalent PF  0.5 mL Intramuscular Tomorrow-1000  . ipratropium-albuterol  3 mL Nebulization QID  . mouth rinse  15 mL Mouth Rinse q12n4p  . pneumococcal 23 valent vaccine  0.5 mL Intramuscular Tomorrow-1000   Continuous Infusions: . ceFEPime (MAXIPIME) IV Stopped (01/14/17 1236)  . dextrose 5 % and 0.9% NaCl 75 mL/hr at 01/14/17 1500  . norepinephrine (LEVOPHED) Adult infusion 9 mcg/min (01/14/17 1500)  . vancomycin Stopped (01/14/17 1117)     LOS: 2 days    Critical care tIme spent: 70 minutes. Greater than 50% of this time was spent in direct contact with the patient coordinating care.     Chaya Jan, MD [facility] Pager [phone]  If 7PM-7AM, please contact night-coverage [email or web address] Password [email or web address] 01/14/2017, 5:44 PM

## 2017-01-15 DIAGNOSIS — R6521 Severe sepsis with septic shock: Secondary | ICD-10-CM

## 2017-01-15 DIAGNOSIS — A419 Sepsis, unspecified organism: Secondary | ICD-10-CM

## 2017-01-15 LAB — BASIC METABOLIC PANEL
Anion gap: 6 (ref 5–15)
BUN: 7 mg/dL (ref 6–20)
CHLORIDE: 106 mmol/L (ref 101–111)
CO2: 27 mmol/L (ref 22–32)
Calcium: 8.3 mg/dL — ABNORMAL LOW (ref 8.9–10.3)
Creatinine, Ser: 0.59 mg/dL (ref 0.44–1.00)
GFR calc Af Amer: >60 mL/min (ref >60)
GLUCOSE: 93 mg/dL (ref 65–99)
POTASSIUM: 3.5 mmol/L (ref 3.5–5.1)
Sodium: 139 mmol/L (ref 135–145)

## 2017-01-15 LAB — CBC
HEMATOCRIT: 32.1 % — AB (ref 36.0–46.0)
HEMOGLOBIN: 10.1 g/dL — AB (ref 12.0–15.0)
MCH: 29.9 pg (ref 26.0–34.0)
MCHC: 31.5 g/dL (ref 30.0–36.0)
MCV: 95 fL (ref 78.0–100.0)
Platelets: 121 10*3/uL — ABNORMAL LOW (ref 150–400)
RBC: 3.38 MIL/uL — AB (ref 3.87–5.11)
RDW: 14.7 % (ref 11.5–15.5)
WBC: 6.4 10*3/uL (ref 4.0–10.5)

## 2017-01-15 LAB — GLUCOSE, CAPILLARY
GLUCOSE-CAPILLARY: 99 mg/dL (ref 65–99)
GLUCOSE-CAPILLARY: 96 mg/dL (ref 65–99)
GLUCOSE-CAPILLARY: 104 mg/dL — AB (ref 65–99)
Glucose-Capillary: 108 mg/dL — ABNORMAL HIGH (ref 65–99)

## 2017-01-15 MED ORDER — LORAZEPAM 2 MG/ML IJ SOLN
0.5000 mg | Freq: Once | INTRAMUSCULAR | Status: AC
  Administered 2017-01-15: 0.5 mg via INTRAVENOUS
  Filled 2017-01-15: qty 1

## 2017-01-15 NOTE — Progress Notes (Signed)
PROGRESS NOTE    [name]  EAV:[id number] DOB: 10-Jun-1925 DOA: 2017/01/21 PCP: Benita Stabile, MD     Brief Narrative:  Patient is an 83 year old woman admitted from home on 10/28 due to garbled speech, decreased mentation, decreased oral intake and confusion.  The patient at baseline lives alone and is wheelchair-bound but son visits frequently.  Patient was found to have a UTI, possibly CVA, possibly pneumonia with signs of sepsis and admission requested.  DNR as of 10/31. No significant changes overnight 10/31-11/1.   Assessment & Plan:   Principal Problem:   Severe sepsis with septic shock (HCC) Active Problems:   Diabetes type 2, controlled (HCC)   CVA (cerebral vascular accident) (HCC)   UTI (urinary tract infection)   Altered mental status   Pressure ulcer of buttock   Sepsis (HCC)   Hypothermia   Multifocal pneumonia   Septic shock -Still requiring pressor support, on norepinephrine will attempt weaning today  -Source is urine, plus minus pneumonia.  Urosepsis -Unfortunately urine culture not requested on admission, blood cultures remain negative.  Is on broad-spectrum antibiotic therapy with vancomycin and cefepime, given severity of illness I believe it is appropriate to continue broad-spectrum antibiotics at this time. -Can consider discontinuation of vancomycin over the next 24 hours if pressor dependency improves given the fact that she was MRSA PCR negative.  Community-acquired pneumonia -This diagnosis is not entirely clear, initial chest x-ray with may be a basilar infiltrate but subsequent x-rays look more like pulmonary edema. -Nonetheless remains on broad-spectrum antibiotic therapy and culture data has been requested.  Acute hypoxemic respiratory failure -Patient is requiring high amount of oxygen, believe she has pulmonary edema due to aggressive fluid resuscitation given sepsis.  However her pressor dependent hypotension limits use of diuretic therapy  at this time.  Sacral/heel pressure ulcers -Present prior to admission, continue air mattress, pressure boots  Metabolic/septic encephalopathy -Due to ongoing infections, would hope for improvement, however given acute CVA will need to see how much she can improve. -As of 11/1 remains with moaning and groaning without intelligible speech.  Acute CVA -As per MRI.  Continue supportive therapy, rectal aspirin.   DVT prophylaxis: Lovenox Code Status: DNR Family Communication: Son at bedside updated on plan of care and all questions answered on 10/31. Disposition Plan: Prolonged discussion with son on 10/31 totaling approximately 50 minutes, we have decided to make her a DNR, continue other aggressive treatments over the next 24 hours however if no significant improvement by then will consider withdrawal of care and hospice.  Consultants:   None  Procedures:   None  Antimicrobials:  Anti-infectives    Start     Dose/Rate Route Frequency Ordered Stop   01/13/17 1100  azithromycin (ZITHROMAX) 250 mg in dextrose 5 % 125 mL IVPB  Status:  Discontinued     250 mg 125 mL/hr over 60 Minutes Intravenous  Once 01/12/17 1051 01/12/17 1102   01/13/17 1100  azithromycin (ZITHROMAX) 250 mg in dextrose 5 % 125 mL IVPB  Status:  Discontinued     250 mg 125 mL/hr over 60 Minutes Intravenous Every 24 hours 01/12/17 1102 01/12/17 1214   01/13/17 1000  vancomycin (VANCOCIN) IVPB 750 mg/150 ml premix     750 mg 150 mL/hr over 60 Minutes Intravenous Every 12 hours 01/13/17 0744     01/12/17 1300  ceFEPIme (MAXIPIME) 2 g in dextrose 5 % 50 mL IVPB     2 g 100 mL/hr over 30 Minutes  Intravenous Every 24 hours 01/12/17 1246     01/12/17 1230  levofloxacin (LEVAQUIN) IVPB 750 mg  Status:  Discontinued     750 mg 100 mL/hr over 90 Minutes Intravenous  Once 01/12/17 1216 01/12/17 1242   01/12/17 1230  aztreonam (AZACTAM) 2 g in dextrose 5 % 50 mL IVPB  Status:  Discontinued     2 g 100 mL/hr over 30  Minutes Intravenous  Once 01/12/17 1216 01/12/17 1242   01/12/17 1230  vancomycin (VANCOCIN) IVPB 1000 mg/200 mL premix     1,000 mg 200 mL/hr over 60 Minutes Intravenous  Once 01/12/17 1216 01/12/17 1630   01/12/17 1100  azithromycin (ZITHROMAX) 500 mg in dextrose 5 % 250 mL IVPB  Status:  Discontinued     500 mg 250 mL/hr over 60 Minutes Intravenous  Once 01/12/17 1051 01/12/17 1253   01/12/17 0115  cefTRIAXone (ROCEPHIN) 1 g in dextrose 5 % 50 mL IVPB  Status:  Discontinued     1 g 100 mL/hr over 30 Minutes Intravenous Every 24 hours 01/12/17 0109 01/12/17 1214       Subjective: Unresponsive, moaning and groaning  Objective: Vitals:   01/15/17 0630 01/15/17 0645 01/15/17 0727 01/15/17 0745  BP: (!) 111/58 (!) 105/56    Pulse: 82 81    Resp: (!) 22 (!) 22    Temp:   (!) 97.4 F (36.3 C)   TempSrc:      SpO2: 93% 94%  93%  Weight:      Height:        Intake/Output Summary (Last 24 hours) at 01/15/17 1009 Last data filed at 01/15/17 0500  Gross per 24 hour  Intake          1670.31 ml  Output             1650 ml  Net            20.31 ml   Filed Weights   01/04/2017 2227 01/12/17 0230 01/15/17 0500  Weight: 64.6 kg (142 lb 6.7 oz) 65.4 kg (144 lb 1.6 oz) 76.7 kg (169 lb)    Examination:  General exam: Unresponsive Respiratory system: Coarse bilateral breath sounds Cardiovascular system:RRR. No murmurs, rubs, gallops. Gastrointestinal system: Abdomen is nondistended, soft and nontender. No organomegaly or masses felt. Normal bowel sounds heard. Central nervous system: Unable to assess given current mental state Extremities: Bilateral heel boots, legs are wrapped in ACE bandage Psychiatry: Unable to assess given current mental state     Data Reviewed: I have personally reviewed following labs and imaging studies  CBC:  Recent Labs Lab 12/21/2016 2231 01/12/17 1435 01/13/17 0524 01/15/17 0432  WBC 9.3 7.0 8.5 6.4  NEUTROABS 8.4*  --  7.7  --   HGB 11.9*  11.0* 11.1* 10.1*  HCT 36.7 34.9* 35.2* 32.1*  MCV 94.3 95.1 95.4 95.0  PLT 181 160 171 121*   Basic Metabolic Panel:  Recent Labs Lab 01/12/17 0518 01/12/17 1435 01/13/17 0524 01/14/17 0518 01/15/17 0432  NA 137 137 135 140 139  K 3.7 3.6 3.7 3.7 3.5  CL 103 105 106 108 106  CO2 29 25 26 28 27   GLUCOSE 106* 125* 87 92 93  BUN 8 7 7 7 7   CREATININE 0.36* 0.36* 0.40* 0.57 0.59  CALCIUM 8.9 8.4* 8.2* 8.0* 8.3*   GFR: Estimated Creatinine Clearance: 47.8 mL/min (by C-G formula based on SCr of 0.59 mg/dL). Liver Function Tests:  Recent Labs Lab 01/12/17 0518 01/12/17 1435  01/13/17 0524  AST 35 48* 49*  ALT 25 34 37  ALKPHOS 78 84 83  BILITOT 0.5 0.6 0.4  PROT 6.4* 6.0* 6.0*  ALBUMIN 2.8* 2.7* 2.5*   No results for input(s): LIPASE, AMYLASE in the last 168 hours. No results for input(s): AMMONIA in the last 168 hours. Coagulation Profile:  Recent Labs Lab 01/12/17 1435  INR 1.30   Cardiac Enzymes:  Recent Labs Lab 01/12/17 1435  TROPONINI <0.03   BNP (last 3 results) No results for input(s): PROBNP in the last 8760 hours. HbA1C: No results for input(s): HGBA1C in the last 72 hours. CBG:  Recent Labs Lab 01/14/17 0837 01/14/17 1134 01/14/17 1602 01/14/17 2058 01/15/17 0728  GLUCAP 85 93 92 86 99   Lipid Profile: No results for input(s): CHOL, HDL, LDLCALC, TRIG, CHOLHDL, LDLDIRECT in the last 72 hours. Thyroid Function Tests: No results for input(s): TSH, T4TOTAL, FREET4, T3FREE, THYROIDAB in the last 72 hours. Anemia Panel: No results for input(s): VITAMINB12, FOLATE, FERRITIN, TIBC, IRON, RETICCTPCT in the last 72 hours. Urine analysis:    Component Value Date/Time   COLORURINE YELLOW 01-31-17 0005   APPEARANCEUR HAZY (A) 01-31-17 0005   LABSPEC 1.020 01-31-17 0005   PHURINE 7.0 01-31-17 0005   GLUCOSEU NEGATIVE 01-31-17 0005   HGBUR MODERATE (A) 01-31-17 0005   BILIRUBINUR NEGATIVE 01-31-17 0005   KETONESUR NEGATIVE  01-31-17 0005   PROTEINUR 100 (A) 01-31-17 0005   UROBILINOGEN 1.0 05/25/2014 1800   NITRITE POSITIVE (A) 01-31-17 0005   LEUKOCYTESUR LARGE (A) 01-31-17 0005   Sepsis Labs: @LABRCNTIP (procalcitonin:4,lacticidven:4)  ) Recent Results (from the past 240 hour(s))  MRSA PCR Screening     Status: None   Collection Time: 01/12/17 12:50 PM  Result Value Ref Range Status   MRSA by PCR NEGATIVE NEGATIVE Final    Comment:        The GeneXpert MRSA Assay (FDA approved for NASAL specimens only), is one component of a comprehensive MRSA colonization surveillance program. It is not intended to diagnose MRSA infection nor to guide or monitor treatment for MRSA infections.   Culture, blood (x 2)     Status: None (Preliminary result)   Collection Time: 01/12/17  2:35 PM  Result Value Ref Range Status   Specimen Description LEFT ANTECUBITAL  Final   Special Requests   Final    BOTTLES DRAWN AEROBIC AND ANAEROBIC Blood Culture results may not be optimal due to an excessive volume of blood received in culture bottles   Culture NO GROWTH 3 DAYS  Final   Report Status PENDING  Incomplete  Culture, blood (x 2)     Status: None (Preliminary result)   Collection Time: 01/12/17  2:35 PM  Result Value Ref Range Status   Specimen Description BLOOD LEFT WRIST  Final   Special Requests   Final    BOTTLES DRAWN AEROBIC ONLY Blood Culture adequate volume   Culture NO GROWTH 3 DAYS  Final   Report Status PENDING  Incomplete         Radiology Studies: Dg Chest Port 1 View  Result Date: 01/14/2017 CLINICAL DATA:  Sepsis/AMS/diabetic Unable to move warming blanket due to pt's mental status-nurse just got pt settled from no rest yesterday and all night EXAM: PORTABLE CHEST 1 VIEW COMPARISON:  01/13/2017 FINDINGS: External artifact noted. Stable cardiac silhouette. There is increase in pulmonary edema pattern within LEFT RIGHT lung. Low lung volumes. PICC line unchanged IMPRESSION: Increasing  pulmonary edema. Electronically Signed  By: Genevive Bi M.D.   On: 01/14/2017 07:41        Scheduled Meds: .  stroke: mapping our early stages of recovery book   Does not apply Once  . aspirin  300 mg Rectal Daily   Or  . aspirin  325 mg Oral Daily  . chlorhexidine  15 mL Mouth Rinse BID  . collagenase   Topical Daily  . enoxaparin (LOVENOX) injection  40 mg Subcutaneous Q24H  . hydrocerin   Topical BID  . Influenza vac split quadrivalent PF  0.5 mL Intramuscular Tomorrow-1000  . ipratropium-albuterol  3 mL Nebulization QID  . mouth rinse  15 mL Mouth Rinse q12n4p  . pneumococcal 23 valent vaccine  0.5 mL Intramuscular Tomorrow-1000   Continuous Infusions: . ceFEPime (MAXIPIME) IV Stopped (01/14/17 1236)  . dextrose 5 % and 0.9% NaCl 75 mL/hr at 01/15/17 1003  . norepinephrine (LEVOPHED) Adult infusion 2 mcg/min (01/15/17 0032)  . vancomycin 750 mg (01/15/17 0959)     LOS: 3 days    Critical care tIme spent: 70 minutes. Greater than 50% of this time was spent in direct contact with the patient coordinating care.     Chaya Jan, MD [facility] Pager [phone]  If 7PM-7AM, please contact night-coverage [email or web address] Password [email or web address] 01/15/2017, 10:09 AM

## 2017-01-15 DEATH — deceased

## 2017-01-16 LAB — BASIC METABOLIC PANEL
ANION GAP: 7 (ref 5–15)
BUN: 7 mg/dL (ref 6–20)
CHLORIDE: 105 mmol/L (ref 101–111)
CO2: 27 mmol/L (ref 22–32)
Calcium: 8.4 mg/dL — ABNORMAL LOW (ref 8.9–10.3)
Creatinine, Ser: 0.49 mg/dL (ref 0.44–1.00)
GFR calc Af Amer: >60 mL/min (ref >60)
GLUCOSE: 105 mg/dL — AB (ref 65–99)
POTASSIUM: 3.3 mmol/L — AB (ref 3.5–5.1)
Sodium: 139 mmol/L (ref 135–145)

## 2017-01-16 LAB — GLUCOSE, CAPILLARY
GLUCOSE-CAPILLARY: 113 mg/dL — AB (ref 65–99)
GLUCOSE-CAPILLARY: 116 mg/dL — AB (ref 65–99)
Glucose-Capillary: 104 mg/dL — ABNORMAL HIGH (ref 65–99)
Glucose-Capillary: 92 mg/dL (ref 65–99)

## 2017-01-16 MED ORDER — PRO-STAT SUGAR FREE PO LIQD
30.0000 mL | Freq: Two times a day (BID) | ORAL | Status: DC
  Administered 2017-01-16 – 2017-01-19 (×3): 30 mL via ORAL
  Filled 2017-01-16 (×5): qty 30

## 2017-01-16 MED ORDER — IPRATROPIUM-ALBUTEROL 0.5-2.5 (3) MG/3ML IN SOLN
3.0000 mL | Freq: Three times a day (TID) | RESPIRATORY_TRACT | Status: DC
  Administered 2017-01-16 – 2017-01-18 (×5): 3 mL via RESPIRATORY_TRACT
  Filled 2017-01-16 (×5): qty 3

## 2017-01-16 MED ORDER — POTASSIUM CHLORIDE 10 MEQ/100ML IV SOLN
10.0000 meq | INTRAVENOUS | Status: AC
  Administered 2017-01-16 (×3): 10 meq via INTRAVENOUS
  Filled 2017-01-16 (×3): qty 100

## 2017-01-16 MED ORDER — ENSURE ENLIVE PO LIQD
237.0000 mL | Freq: Two times a day (BID) | ORAL | Status: DC
  Administered 2017-01-16 – 2017-01-19 (×4): 237 mL via ORAL

## 2017-01-16 NOTE — Care Management Note (Signed)
Case Management Note  Patient Details  Name: Sandrea HughsRuth J Allen MRN: [id number] Date of Birth: January 21, 1926  Subjective/Objective:    Patient adm with severe sepsis. From home with son. Active with [facility] and [facility]. Patient has been made DNR this admission. Some discussions with son about hospice/Goals of Care.                 Action/Plan:  CM following for needs. CSW consult placed per verbal order.    Expected Discharge Date:  01/14/17               Expected Discharge Plan:     In-House Referral:  Clinical Social Work  Discharge planning Services  CM Consult  Post Acute Care Choice:    Choice offered to:     DME Arranged:    DME Agency:     HH Arranged:    HH Agency:     Status of Service:  In process, will continue to follow  If discussed at Long Length of Stay Meetings, dates discussed:    Additional Comments:  Tomekia Helton, Chrystine OilerSharley Diane, RN 01/16/2017, 10:56 AM

## 2017-01-16 NOTE — Plan of Care (Signed)
Problem: Nutrition: Goal: Risk of aspiration will decrease Outcome: Progressing Monitor for signs and symptoms for aspiration. Assist patient with meals.

## 2017-01-16 NOTE — Progress Notes (Signed)
Nutrition Follow-up  DOCUMENTATION CODES:      INTERVENTION:  If pt is receptive trial: Ensure Enlive po BID, each supplement provides 350 kcal and 20 grams of protein   If she continues to be unable to take po's and is sufficiently warmed then recommend consider placing NGT and starting tube feeding:  Initiate Vital 1.2  @ 55 ml/hr (1320 ml every 24 hrs) via NGT.   Tube feeding regimen provides 1584 kcal , 98 grams of protein, and  1070 ml of H2O.  Will provide 100% of needs.  NUTRITION DIAGNOSIS:   Increased nutrient needs related to inability to eat, wound healing (acute CVA, sepsis (UTI and PNA)) as evidenced by estimated needs, NPO status, edema.   GOAL:  Provide needs based on ASPEN/SCCM guidelines    MONITOR:   Diet advancement, PO intake, Skin  REASON FOR ASSESSMENT:   Malnutrition Screening Tool    ASSESSMENT:   Patient is an 83 yo who presents with acute CVA, unresponsive and septic.  10/29 US Caroid bilateral- no acute findings. CT Headacute no acute intracranial hemorrhage. 10/29 MRI Brain and Head -acute nonhemorrhagic infarction in the LEFT frontal lobe.  She has garbled speech still unable to advance diet. Yells out at times. She remains hypothermic-with a CORE temperature of 92.6 (33.7) at 1300 today.   Weight status- gain noted and pt progress reviewed and discussed with nursing. Expect this may be equipment related and not severe gain. The patient does have mild upper extremity edema- none to lower extremities.  Her diet is fully advanced however no po intake of meals as yet. Her son tried to feed her today but she is refusing. Concern for developing malnutrition given her septic state and unable to meet nutrition requirements orally at this time.   Recent Labs Lab 01/14/17 0518 01/15/17 0432 01/16/17 0411  NA 140 139 139  K 3.7 3.5 3.3*  CL 108 106 105  CO2 28 27 27   BUN 7 7 7   CREATININE 0.57 0.59 0.49  CALCIUM 8.0* 8.3* 8.4*  GLUCOSE 92 93  105*   Labs: albumin 2.5 on 10/30. Potassium 3.3 today. Hemoglobin 10.1.   Meds: reviewed  NUTRITION - FOCUSED PHYSICAL EXAM:    Most Recent Value  Orbital Region  No depletion  Upper Arm Region  No depletion  Thoracic and Lumbar Region  Unable to assess  Buccal Region  No depletion  Temple Region  Mild depletion  Clavicle Bone Region  No depletion  Clavicle and Acromion Bone Region  Mild depletion  Scapular Bone Region  Unable to assess  Dorsal Hand  -- [edema bilateral hands]  Patellar Region  Unable to assess  Anterior Thigh Region  Unable to assess  Posterior Calf Region  Unable to assess  Edema (RD Assessment)  Mild [Bilteral upper extremities-none to lower per nursing assessment]  Hair  Reviewed  Eyes  Reviewed  Mouth  Other (Comment)  Skin  Reviewed  Nails  Reviewed       Diet Order:  Diet heart healthy/carb modified Room service appropriate? Yes; Fluid consistency: Thin  EDUCATION NEEDS:   No education needs have been identified at this time  Skin:  Skin Assessment: Reviewed RN Assessment  Last BM:  unknown  Height:   Ht Readings from Last 1 Encounters:  01/12/17 5\' 4"  (1.626 m)    Weight:   Wt Readings from Last 1 Encounters:  01/16/17 170 lb (77.1 kg)  10/29- 144 lb (65.4 kg)   Ideal Body  Weight:  55 kg  BMI:  Body mass index is 29.18 kg/m.  Estimated Nutritional Needs:   Kcal:  1500-1600  Protein:  78-85 gr  Fluid:  1.5-1.6 liters daily   Royann Shivers MS,RD,CSG,LDN Office: [phone] Pager: [phone]

## 2017-01-16 NOTE — Progress Notes (Addendum)
Patient rectal temp 92.6. MD paged to make aware, bear hugger-reapplied. Patients son present at bedside and made aware as well.

## 2017-01-16 NOTE — Progress Notes (Signed)
PROGRESS NOTE    [name] Page  ZOX:[id number] DOB: 02-08-1926 DOA: 12/15/2016 PCP: Benita Stabile, MD     Brief Narrative:  Patient is an 83 year old woman admitted from home on 10/28 due to garbled speech, decreased mentation, decreased oral intake and confusion.  The patient at baseline lives alone and is wheelchair-bound but son visits frequently.  Patient was found to have a UTI, possibly CVA, possibly pneumonia with signs of sepsis and admission requested.  DNR as of 10/31. No significant changes overnight 10/31-11/1.  As of 11/2 she has her eyes open, however continues with  nonsensical moaning and groaning.   Assessment & Plan:   Principal Problem:   Severe sepsis with septic shock (HCC) Active Problems:   Diabetes type 2, controlled (HCC)   CVA (cerebral vascular accident) (HCC)   UTI (urinary tract infection)   Altered mental status   Pressure ulcer of buttock   Sepsis (HCC)   Hypothermia   Multifocal pneumonia   Septic shock -Weaned off norepinephrine as of 11/1. -She remains hypothermic. -Source is urine, plus minus pneumonia.  Urosepsis -Unfortunately urine culture not requested on admission, blood cultures remain negative.  Is on broad-spectrum antibiotic therapy with vancomycin and cefepime, given severity of illness I believe it is appropriate to continue broad-spectrum antibiotics at this time. -We will discontinue vancomycin given negative MRSA PCR.  Community-acquired pneumonia -This diagnosis is not entirely clear, initial chest x-ray with may be a basilar infiltrate but subsequent x-rays look more like pulmonary edema. -Nonetheless remains on broad-spectrum antibiotic therapy and culture data has been requested.  Hypokalemia -Replace via IV.  Acute hypoxemic respiratory failure -Patient is requiring high amount of oxygen, believe she has pulmonary edema due to aggressive fluid resuscitation given sepsis.  However her pressor dependent hypotension limits  use of diuretic therapy at this time.  Sacral/heel pressure ulcers -Present prior to admission, continue air mattress, pressure boots  Metabolic/septic encephalopathy -Due to ongoing infections, would hope for improvement, however given acute CVA will need to see how much she can improve. -As of 11/1 remains with moaning and groaning without intelligible speech.  Acute CVA -As per MRI.  Continue supportive therapy, rectal aspirin.   DVT prophylaxis: Lovenox Code Status: DNR Family Communication: Son at bedside updated on plan of care and all questions answered on 10/31. Disposition Plan: Prolonged discussion with son on 10/31 totaling approximately 50 minutes, we have decided to make her a DNR, continue other aggressive treatments over the next 24 hours however if no significant improvement by then will consider withdrawal of care and hospice.  Consultants:   None  Procedures:   None  Antimicrobials:  Anti-infectives    Start     Dose/Rate Route Frequency Ordered Stop   01/13/17 1100  azithromycin (ZITHROMAX) 250 mg in dextrose 5 % 125 mL IVPB  Status:  Discontinued     250 mg 125 mL/hr over 60 Minutes Intravenous  Once 01/12/17 1051 01/12/17 1102   01/13/17 1100  azithromycin (ZITHROMAX) 250 mg in dextrose 5 % 125 mL IVPB  Status:  Discontinued     250 mg 125 mL/hr over 60 Minutes Intravenous Every 24 hours 01/12/17 1102 01/12/17 1214   01/13/17 1000  vancomycin (VANCOCIN) IVPB 750 mg/150 ml premix     750 mg 150 mL/hr over 60 Minutes Intravenous Every 12 hours 01/13/17 0744     01/12/17 1300  ceFEPIme (MAXIPIME) 2 g in dextrose 5 % 50 mL IVPB     2 g  100 mL/hr over 30 Minutes Intravenous Every 24 hours 01/12/17 1246     01/12/17 1230  levofloxacin (LEVAQUIN) IVPB 750 mg  Status:  Discontinued     750 mg 100 mL/hr over 90 Minutes Intravenous  Once 01/12/17 1216 01/12/17 1242   01/12/17 1230  aztreonam (AZACTAM) 2 g in dextrose 5 % 50 mL IVPB  Status:  Discontinued     2  g 100 mL/hr over 30 Minutes Intravenous  Once 01/12/17 1216 01/12/17 1242   01/12/17 1230  vancomycin (VANCOCIN) IVPB 1000 mg/200 mL premix     1,000 mg 200 mL/hr over 60 Minutes Intravenous  Once 01/12/17 1216 01/12/17 1630   01/12/17 1100  azithromycin (ZITHROMAX) 500 mg in dextrose 5 % 250 mL IVPB  Status:  Discontinued     500 mg 250 mL/hr over 60 Minutes Intravenous  Once 01/12/17 1051 01/12/17 1253   01/12/17 0115  cefTRIAXone (ROCEPHIN) 1 g in dextrose 5 % 50 mL IVPB  Status:  Discontinued     1 g 100 mL/hr over 30 Minutes Intravenous Every 24 hours 01/12/17 0109 01/12/17 1214       Subjective: Unresponsive, moaning and groaning.  Objective: Vitals:   01/16/17 1000 01/16/17 1100 01/16/17 1200 01/16/17 1300  BP: 136/77 136/76 (!) 138/114 (!) 148/85  Pulse: 84 84 83 84  Resp:  18 18 14   Temp:    (!) 92.6 F (33.7 C)  TempSrc:    Rectal  SpO2: 96% 95% 97% 94%  Weight:      Height:        Intake/Output Summary (Last 24 hours) at 01/16/17 1311 Last data filed at 01/16/17 0841  Gross per 24 hour  Intake          1609.41 ml  Output             1650 ml  Net           -40.59 ml   Filed Weights   01/12/17 0230 01/15/17 0500 01/16/17 0500  Weight: 65.4 kg (144 lb 1.6 oz) 76.7 kg (169 lb) 77.1 kg (170 lb)    Examination:  General exam: Eyes open, not responsive, nonsensical moaning Respiratory system: Coarse bilateral breath sounds Cardiovascular system:RRR. No murmurs, rubs, gallops. Gastrointestinal system: Abdomen is nondistended, soft and nontender. No organomegaly or masses felt. Normal bowel sounds heard. Central nervous system: Unable to fully assess given current mental state Extremities: No C/C/E, +pedal pulses Psychiatry: Unable to fully assess given current mental state      Data Reviewed: I have personally reviewed following labs and imaging studies  CBC:  Recent Labs Lab 17-Jan-2017 2231 01/12/17 1435 01/13/17 0524 01/15/17 0432  WBC 9.3 7.0  8.5 6.4  NEUTROABS 8.4*  --  7.7  --   HGB 11.9* 11.0* 11.1* 10.1*  HCT 36.7 34.9* 35.2* 32.1*  MCV 94.3 95.1 95.4 95.0  PLT 181 160 171 121*   Basic Metabolic Panel:  Recent Labs Lab 01/12/17 1435 01/13/17 0524 01/14/17 0518 01/15/17 0432 01/16/17 0411  NA 137 135 140 139 139  K 3.6 3.7 3.7 3.5 3.3*  CL 105 106 108 106 105  CO2 25 26 28 27 27   GLUCOSE 125* 87 92 93 105*  BUN 7 7 7 7 7   CREATININE 0.36* 0.40* 0.57 0.59 0.49  CALCIUM 8.4* 8.2* 8.0* 8.3* 8.4*   GFR: Estimated Creatinine Clearance: 47.9 mL/min (by C-G formula based on SCr of 0.49 mg/dL). Liver Function Tests:  Recent Labs Lab 01/12/17  0518 01/12/17 1435 01/13/17 0524  AST 35 48* 49*  ALT 25 34 37  ALKPHOS 78 84 83  BILITOT 0.5 0.6 0.4  PROT 6.4* 6.0* 6.0*  ALBUMIN 2.8* 2.7* 2.5*   No results for input(s): LIPASE, AMYLASE in the last 168 hours. No results for input(s): AMMONIA in the last 168 hours. Coagulation Profile:  Recent Labs Lab 01/12/17 1435  INR 1.30   Cardiac Enzymes:  Recent Labs Lab 01/12/17 1435  TROPONINI <0.03   BNP (last 3 results) No results for input(s): PROBNP in the last 8760 hours. HbA1C: No results for input(s): HGBA1C in the last 72 hours. CBG:  Recent Labs Lab 01/15/17 1102 01/15/17 1545 01/15/17 2049 01/16/17 0731 01/16/17 1103  GLUCAP 96 104* 108* 104* 113*   Lipid Profile: No results for input(s): CHOL, HDL, LDLCALC, TRIG, CHOLHDL, LDLDIRECT in the last 72 hours. Thyroid Function Tests: No results for input(s): TSH, T4TOTAL, FREET4, T3FREE, THYROIDAB in the last 72 hours. Anemia Panel: No results for input(s): VITAMINB12, FOLATE, FERRITIN, TIBC, IRON, RETICCTPCT in the last 72 hours. Urine analysis:    Component Value Date/Time   COLORURINE YELLOW 01/05/2017 0005   APPEARANCEUR HAZY (A) 01/01/2017 0005   LABSPEC 1.020 01/14/2017 0005   PHURINE 7.0 12/31/2016 0005   GLUCOSEU NEGATIVE 12/20/2016 0005   HGBUR MODERATE (A) 12/27/2016 0005    BILIRUBINUR NEGATIVE 12/22/2016 0005   KETONESUR NEGATIVE 01/10/2017 0005   PROTEINUR 100 (A) 01/13/2017 0005   UROBILINOGEN 1.0 05/25/2014 1800   NITRITE POSITIVE (A) 12/25/2016 0005   LEUKOCYTESUR LARGE (A) 01/05/2017 0005   Sepsis Labs: @LABRCNTIP (procalcitonin:4,lacticidven:4)  ) Recent Results (from the past 240 hour(s))  MRSA PCR Screening     Status: None   Collection Time: 01/12/17 12:50 PM  Result Value Ref Range Status   MRSA by PCR NEGATIVE NEGATIVE Final    Comment:        The GeneXpert MRSA Assay (FDA approved for NASAL specimens only), is one component of a comprehensive MRSA colonization surveillance program. It is not intended to diagnose MRSA infection nor to guide or monitor treatment for MRSA infections.   Culture, blood (x 2)     Status: None (Preliminary result)   Collection Time: 01/12/17  2:35 PM  Result Value Ref Range Status   Specimen Description LEFT ANTECUBITAL  Final   Special Requests   Final    BOTTLES DRAWN AEROBIC AND ANAEROBIC Blood Culture results may not be optimal due to an excessive volume of blood received in culture bottles   Culture NO GROWTH 4 DAYS  Final   Report Status PENDING  Incomplete  Culture, blood (x 2)     Status: None (Preliminary result)   Collection Time: 01/12/17  2:35 PM  Result Value Ref Range Status   Specimen Description BLOOD LEFT WRIST  Final   Special Requests   Final    BOTTLES DRAWN AEROBIC ONLY Blood Culture adequate volume   Culture NO GROWTH 4 DAYS  Final   Report Status PENDING  Incomplete  Culture, Urine     Status: Abnormal (Preliminary result)   Collection Time: 01/14/17  5:54 PM  Result Value Ref Range Status   Specimen Description URINE, CATHETERIZED  Final   Special Requests NONE  Final   Culture >=100,000 COLONIES/mL GRAM NEGATIVE RODS (A)  Final   Report Status PENDING  Incomplete         Radiology Studies: No results found.      Scheduled Meds: .  stroke: mapping  our early  stages of recovery book   Does not apply Once  . aspirin  300 mg Rectal Daily   Or  . aspirin  325 mg Oral Daily  . chlorhexidine  15 mL Mouth Rinse BID  . collagenase   Topical Daily  . enoxaparin (LOVENOX) injection  40 mg Subcutaneous Q24H  . hydrocerin   Topical BID  . Influenza vac split quadrivalent PF  0.5 mL Intramuscular Tomorrow-1000  . ipratropium-albuterol  3 mL Nebulization QID  . mouth rinse  15 mL Mouth Rinse q12n4p  . pneumococcal 23 valent vaccine  0.5 mL Intramuscular Tomorrow-1000   Continuous Infusions: . ceFEPime (MAXIPIME) IV Stopped (01/16/17 1319)  . dextrose 5 % and 0.9% NaCl 75 mL/hr at 01/16/17 0600  . norepinephrine (LEVOPHED) Adult infusion Stopped (01/16/17 1610)  . vancomycin Stopped (01/16/17 1105)     LOS: 4 days    Critical care tIme spent: 70 minutes. Greater than 50% of this time was spent in direct contact with the patient coordinating care.     Chaya Jan, MD [facility] Pager [phone]  If 7PM-7AM, please contact night-coverage [email or web address] Password [email or web address] 01/16/2017, 1:11 PM

## 2017-01-16 NOTE — Clinical Social Work Note (Signed)
Clinical Social Work Assessment  Patient Details  Name: [name] MRN: [id number] Date of Birth: 02/22/1926  Date of referral:  01/16/17               Reason for consult:  Facility Placement                Permission sought to share information with:    Permission granted to share information::     Name::        Agency::     Relationship::     Contact Information:  son, [name]  Housing/Transportation Living arrangements for the past 2 months:  Single Family Home Source of Information:  Adult Children Patient Interpreter Needed:  None Criminal Activity/Legal Involvement Pertinent to Current Situation/Hospitalization:  No - Comment as needed Significant Relationships:  Adult Children Lives with:  Adult Children Do you feel safe going back to the place where you live?  Yes Need for family participation in patient care:  Yes (Comment)  Care giving concerns:  None identified at baseline.    Social Worker assessment / plan:  Patient uses a wheelchair and per son, is very independent. She feeds herself and completes ADLs with his assistance. LCSW discussed referral was made for SNF placement, however when patient was medically stable enough, she would have a PT evaluation to determine recommendation.   Employment status:  Retired Health and safety inspectornsurance information:  Medicare PT Recommendations:  Not assessed at this time Information / Referral to community resources:     Patient/Family's Response to care:  Patient's family is agreeable to SNF should it be recommended once she is medically stable.   Patient/Family's Understanding of and Emotional Response to Diagnosis, Current Treatment, and Prognosis:  Family verbalize understanding of patient's diagnosis, treatment and prognosis.   Emotional Assessment Appearance:  Appears stated age Attitude/Demeanor/Rapport:  Unable to Assess Affect (typically observed):  Unable to Assess Orientation:    Alcohol / Substance use:    Psych involvement  (Current and /or in the community):     Discharge Needs  Concerns to be addressed:  Discharge Planning Concerns Readmission within the last 30 days:  No Current discharge risk:  None Barriers to Discharge:  No Barriers Identified   Annice NeedySettle, Nyliah Nierenberg D, LCSW 01/16/2017, 3:21 PM

## 2017-01-16 NOTE — Progress Notes (Signed)
Supportive visit with Ms. [name] and her son.

## 2017-01-17 LAB — BASIC METABOLIC PANEL
Anion gap: 7 (ref 5–15)
BUN: 6 mg/dL (ref 6–20)
CALCIUM: 8.5 mg/dL — AB (ref 8.9–10.3)
CO2: 28 mmol/L (ref 22–32)
Chloride: 104 mmol/L (ref 101–111)
Creatinine, Ser: 0.57 mg/dL (ref 0.44–1.00)
GFR calc non Af Amer: >60 mL/min (ref >60)
GLUCOSE: 106 mg/dL — AB (ref 65–99)
Potassium: 3.5 mmol/L (ref 3.5–5.1)
Sodium: 139 mmol/L (ref 135–145)

## 2017-01-17 LAB — CBC
HCT: 31 % — ABNORMAL LOW (ref 36.0–46.0)
HEMOGLOBIN: 10 g/dL — AB (ref 12.0–15.0)
MCH: 30 pg (ref 26.0–34.0)
MCHC: 32.3 g/dL (ref 30.0–36.0)
MCV: 93.1 fL (ref 78.0–100.0)
Platelets: 80 10*3/uL — ABNORMAL LOW (ref 150–400)
RBC: 3.33 MIL/uL — ABNORMAL LOW (ref 3.87–5.11)
RDW: 14.3 % (ref 11.5–15.5)
WBC: 7.5 10*3/uL (ref 4.0–10.5)

## 2017-01-17 LAB — CULTURE, BLOOD (ROUTINE X 2)
CULTURE: NO GROWTH
Culture: NO GROWTH
SPECIAL REQUESTS: ADEQUATE

## 2017-01-17 LAB — GLUCOSE, CAPILLARY
GLUCOSE-CAPILLARY: 114 mg/dL — AB (ref 65–99)
GLUCOSE-CAPILLARY: 129 mg/dL — AB (ref 65–99)
Glucose-Capillary: 113 mg/dL — ABNORMAL HIGH (ref 65–99)
Glucose-Capillary: 117 mg/dL — ABNORMAL HIGH (ref 65–99)

## 2017-01-17 LAB — URINE CULTURE: Culture: >=100000 — AB

## 2017-01-17 NOTE — Progress Notes (Signed)
PROGRESS NOTE    [name]  XLK:[id number] DOB: September 14, 1925 DOA: Jan 19, 2017 PCP: Benita Stabile, MD     Brief Narrative:  Patient is an 83 year old woman admitted from home on 10/28 due to garbled speech, decreased mentation, decreased oral intake and confusion.  The patient at baseline lives alone and is wheelchair-bound but son visits frequently.  Patient was found to have a UTI, possibly CVA, possibly pneumonia with signs of sepsis and admission requested.  DNR as of 10/31. No significant changes overnight 10/31-11/1.  As of 11/2 she has her eyes open, however continues with  nonsensical moaning and groaning.   Assessment & Plan:   Principal Problem:   Severe sepsis with septic shock (HCC) Active Problems:   Diabetes type 2, controlled (HCC)   CVA (cerebral vascular accident) (HCC)   UTI (urinary tract infection)   Altered mental status   Pressure ulcer of buttock   Sepsis (HCC)   Hypothermia   Multifocal pneumonia   Septic shock -Weaned off norepinephrine as of 11/1. -She remains hypothermic. -Source is urine, plus minus pneumonia.  Urosepsis -Bblood cultures remain negative.  -Culture from 10/31 shows Proteus resistant only to Macrobid.    -Continue cefepime.  Community-acquired pneumonia -This diagnosis is not entirely clear, initial chest x-ray with may be a basilar infiltrate but subsequent x-rays look more like pulmonary edema. -Nonetheless remains on broad-spectrum antibiotic therapy and culture data has been requested.  Hypokalemia -Replaced  Acute hypoxemic respiratory failure -Patient is requiring high amount of oxygen, believe she has pulmonary edema due to aggressive fluid resuscitation given sepsis.  However her pressor dependent hypotension limits use of diuretic therapy at this time. -Oxygen requirements are decreasing.  Sacral/heel pressure ulcers -Present prior to admission, continue air mattress, pressure boots  Metabolic/septic  encephalopathy -Due to ongoing infections, would hope for improvement, however given acute CVA will need to see how much she can improve. -As of 11/1 remains with moaning and groaning without intelligible speech.  Acute CVA -As per MRI.  Continue supportive therapy, rectal aspirin.   DVT prophylaxis: Lovenox Code Status: DNR Family Communication: Son at bedside updated on plan of care and all questions answered on 10/31. Disposition Plan: Prolonged discussion with son on 10/31 totaling approximately 50 minutes, we have decided to make her a DNR, continue other aggressive treatments over the next 24 hours however if no significant improvement by then will consider withdrawal of care and hospice.  Consultants:   None  Procedures:   None  Antimicrobials:  Anti-infectives    Start     Dose/Rate Route Frequency Ordered Stop   01/13/17 1100  azithromycin (ZITHROMAX) 250 mg in dextrose 5 % 125 mL IVPB  Status:  Discontinued     250 mg 125 mL/hr over 60 Minutes Intravenous  Once 01/12/17 1051 01/12/17 1102   01/13/17 1100  azithromycin (ZITHROMAX) 250 mg in dextrose 5 % 125 mL IVPB  Status:  Discontinued     250 mg 125 mL/hr over 60 Minutes Intravenous Every 24 hours 01/12/17 1102 01/12/17 1214   01/13/17 1000  vancomycin (VANCOCIN) IVPB 750 mg/150 ml premix  Status:  Discontinued     750 mg 150 mL/hr over 60 Minutes Intravenous Every 12 hours 01/13/17 0744 01/16/17 1314   01/12/17 1300  ceFEPIme (MAXIPIME) 2 g in dextrose 5 % 50 mL IVPB     2 g 100 mL/hr over 30 Minutes Intravenous Every 24 hours 01/12/17 1246     01/12/17 1230  levofloxacin (LEVAQUIN)  IVPB 750 mg  Status:  Discontinued     750 mg 100 mL/hr over 90 Minutes Intravenous  Once 01/12/17 1216 01/12/17 1242   01/12/17 1230  aztreonam (AZACTAM) 2 g in dextrose 5 % 50 mL IVPB  Status:  Discontinued     2 g 100 mL/hr over 30 Minutes Intravenous  Once 01/12/17 1216 01/12/17 1242   01/12/17 1230  vancomycin (VANCOCIN) IVPB  1000 mg/200 mL premix     1,000 mg 200 mL/hr over 60 Minutes Intravenous  Once 01/12/17 1216 01/12/17 1630   01/12/17 1100  azithromycin (ZITHROMAX) 500 mg in dextrose 5 % 250 mL IVPB  Status:  Discontinued     500 mg 250 mL/hr over 60 Minutes Intravenous  Once 01/12/17 1051 01/12/17 1253   01/12/17 0115  cefTRIAXone (ROCEPHIN) 1 g in dextrose 5 % 50 mL IVPB  Status:  Discontinued     1 g 100 mL/hr over 30 Minutes Intravenous Every 24 hours 01/12/17 0109 01/12/17 1214       Subjective: Unresponsive, occasional moaning and groaning  Objective: Vitals:   01/17/17 1400 01/17/17 1453 01/17/17 1500 01/17/17 1600  BP: 127/73  (!) 148/81 (!) 151/81  Pulse: (!) 104  (!) 102 (!) 104  Resp: (!) 23  (!) 23 (!) 22  Temp:    99.3 F (37.4 C)  TempSrc:    Oral  SpO2: 92% 98% 97% 96%  Weight:      Height:        Intake/Output Summary (Last 24 hours) at 01/17/17 1820 Last data filed at 01/17/17 1748  Gross per 24 hour  Intake             2250 ml  Output             1150 ml  Net             1100 ml   Filed Weights   01/12/17 0230 01/15/17 0500 01/16/17 0500  Weight: 65.4 kg (144 lb 1.6 oz) 76.7 kg (169 lb) 77.1 kg (170 lb)    Examination:  General exam: Not responsive today Respiratory system: Coarse bilateral breath sounds Cardiovascular system:RRR. No murmurs, rubs, gallops. Gastrointestinal system: Abdomen is nondistended, soft and nontender. No organomegaly or masses felt. Normal bowel sounds heard. Central nervous system: Unable to fully assess given current mental state Extremities: With bilateral heel boots and cleanly wrapped in Ace bandage Psychiatry: Unable to assess given current mental state       Data Reviewed: I have personally reviewed following labs and imaging studies  CBC:  Recent Labs Lab 01/12/2017 2231 01/12/17 1435 01/13/17 0524 01/15/17 0432 01/17/17 0419  WBC 9.3 7.0 8.5 6.4 7.5  NEUTROABS 8.4*  --  7.7  --   --   HGB 11.9* 11.0* 11.1* 10.1*  10.0*  HCT 36.7 34.9* 35.2* 32.1* 31.0*  MCV 94.3 95.1 95.4 95.0 93.1  PLT 181 160 171 121* 80*   Basic Metabolic Panel:  Recent Labs Lab 01/13/17 0524 01/14/17 0518 01/15/17 0432 01/16/17 0411 01/17/17 0419  NA 135 140 139 139 139  K 3.7 3.7 3.5 3.3* 3.5  CL 106 108 106 105 104  CO2 26 28 27 27 28   GLUCOSE 87 92 93 105* 106*  BUN 7 7 7 7 6   CREATININE 0.40* 0.57 0.59 0.49 0.57  CALCIUM 8.2* 8.0* 8.3* 8.4* 8.5*   GFR: Estimated Creatinine Clearance: 47.9 mL/min (by C-G formula based on SCr of 0.57 mg/dL). Liver Function Tests:  Recent Labs Lab 01/12/17 0518 01/12/17 1435 01/13/17 0524  AST 35 48* 49*  ALT 25 34 37  ALKPHOS 78 84 83  BILITOT 0.5 0.6 0.4  PROT 6.4* 6.0* 6.0*  ALBUMIN 2.8* 2.7* 2.5*   No results for input(s): LIPASE, AMYLASE in the last 168 hours. No results for input(s): AMMONIA in the last 168 hours. Coagulation Profile:  Recent Labs Lab 01/12/17 1435  INR 1.30   Cardiac Enzymes:  Recent Labs Lab 01/12/17 1435  TROPONINI <0.03   BNP (last 3 results) No results for input(s): PROBNP in the last 8760 hours. HbA1C: No results for input(s): HGBA1C in the last 72 hours. CBG:  Recent Labs Lab 01/16/17 1633 01/16/17 2010 01/17/17 0801 01/17/17 1123 01/17/17 1625  GLUCAP 92 116* 113* 114* 129*   Lipid Profile: No results for input(s): CHOL, HDL, LDLCALC, TRIG, CHOLHDL, LDLDIRECT in the last 72 hours. Thyroid Function Tests: No results for input(s): TSH, T4TOTAL, FREET4, T3FREE, THYROIDAB in the last 72 hours. Anemia Panel: No results for input(s): VITAMINB12, FOLATE, FERRITIN, TIBC, IRON, RETICCTPCT in the last 72 hours. Urine analysis:    Component Value Date/Time   COLORURINE YELLOW 01-27-17 0005   APPEARANCEUR HAZY (A) 01/27/2017 0005   LABSPEC 1.020 01-27-2017 0005   PHURINE 7.0 January 27, 2017 0005   GLUCOSEU NEGATIVE 01/27/17 0005   HGBUR MODERATE (A) 01-27-17 0005   BILIRUBINUR NEGATIVE January 27, 2017 0005   KETONESUR  NEGATIVE 01/27/17 0005   PROTEINUR 100 (A) January 27, 2017 0005   UROBILINOGEN 1.0 05/25/2014 1800   NITRITE POSITIVE (A) 27-Jan-2017 0005   LEUKOCYTESUR LARGE (A) 01-27-2017 0005   Sepsis Labs: @LABRCNTIP (procalcitonin:4,lacticidven:4)  ) Recent Results (from the past 240 hour(s))  MRSA PCR Screening     Status: None   Collection Time: 01/12/17 12:50 PM  Result Value Ref Range Status   MRSA by PCR NEGATIVE NEGATIVE Final    Comment:        The GeneXpert MRSA Assay (FDA approved for NASAL specimens only), is one component of a comprehensive MRSA colonization surveillance program. It is not intended to diagnose MRSA infection nor to guide or monitor treatment for MRSA infections.   Culture, blood (x 2)     Status: None   Collection Time: 01/12/17  2:35 PM  Result Value Ref Range Status   Specimen Description LEFT ANTECUBITAL  Final   Special Requests   Final    BOTTLES DRAWN AEROBIC AND ANAEROBIC Blood Culture results may not be optimal due to an excessive volume of blood received in culture bottles   Culture NO GROWTH 5 DAYS  Final   Report Status 01/17/2017 FINAL  Final  Culture, blood (x 2)     Status: None   Collection Time: 01/12/17  2:35 PM  Result Value Ref Range Status   Specimen Description BLOOD LEFT WRIST  Final   Special Requests   Final    BOTTLES DRAWN AEROBIC ONLY Blood Culture adequate volume   Culture NO GROWTH 5 DAYS  Final   Report Status 01/17/2017 FINAL  Final  Culture, Urine     Status: Abnormal   Collection Time: 01/14/17  5:54 PM  Result Value Ref Range Status   Specimen Description URINE, CATHETERIZED  Final   Special Requests NONE  Final   Culture >=100,000 COLONIES/mL PROTEUS MIRABILIS (A)  Final   Report Status 01/17/2017 FINAL  Final   Organism ID, Bacteria PROTEUS MIRABILIS (A)  Final      Susceptibility   Proteus mirabilis - MIC*  AMPICILLIN <=2 SENSITIVE Sensitive     CEFAZOLIN <=4 SENSITIVE Sensitive     CEFTRIAXONE <=1 SENSITIVE  Sensitive     CIPROFLOXACIN <=0.25 SENSITIVE Sensitive     GENTAMICIN <=1 SENSITIVE Sensitive     IMIPENEM <=0.25 SENSITIVE Sensitive     NITROFURANTOIN >=512 RESISTANT Resistant     TRIMETH/SULFA <=20 SENSITIVE Sensitive     AMPICILLIN/SULBACTAM <=2 SENSITIVE Sensitive     PIP/TAZO <=4 SENSITIVE Sensitive     * >=100,000 COLONIES/mL PROTEUS MIRABILIS         Radiology Studies: No results found.      Scheduled Meds: .  stroke: mapping our early stages of recovery book   Does not apply Once  . aspirin  300 mg Rectal Daily   Or  . aspirin  325 mg Oral Daily  . chlorhexidine  15 mL Mouth Rinse BID  . collagenase   Topical Daily  . enoxaparin (LOVENOX) injection  40 mg Subcutaneous Q24H  . feeding supplement (ENSURE ENLIVE)  237 mL Oral BID BM  . feeding supplement (PRO-STAT SUGAR FREE 64)  30 mL Oral BID  . hydrocerin   Topical BID  . Influenza vac split quadrivalent PF  0.5 mL Intramuscular Tomorrow-1000  . ipratropium-albuterol  3 mL Nebulization TID  . mouth rinse  15 mL Mouth Rinse q12n4p  . pneumococcal 23 valent vaccine  0.5 mL Intramuscular Tomorrow-1000   Continuous Infusions: . ceFEPime (MAXIPIME) IV Stopped (01/17/17 1242)  . dextrose 5 % and 0.9% NaCl 75 mL/hr at 01/17/17 1746  . norepinephrine (LEVOPHED) Adult infusion Stopped (01/16/17 0648)     LOS: 5 days    Time spent: 35 minutes. Greater than 50% of this time was spent in direct contact with the patient coordinating care.     Chaya JanHERNANDEZ ACOSTA,ESTELA, MD [facility] Pager [phone]  If 7PM-7AM, please contact night-coverage [email or web address] Password [email or web address] 01/17/2017, 6:20 PM

## 2017-01-17 NOTE — Plan of Care (Signed)
Problem: Nutrition: Goal: Dietary intake will improve Outcome: Progressing Encouraged patient to drink ensure and other nutritional supplements during the shift.

## 2017-01-17 NOTE — Progress Notes (Signed)
Pharmacy Antibiotic Note  [name] is a 83 y.o. female admitted on 01/05/2017 with sepsis.  Pharmacy has been consulted for Cefepime dosing.  Plan: Cefepime 2gm IV q24 Proteus in UC.  MD wishes to continue broad spectrum antibiotics for now  Height: 5\' 4"  (162.6 cm) Weight: 170 lb (77.1 kg) IBW/kg (Calculated) : 54.7  Temp (24hrs), Avg:97.8 F (36.6 C), Min:92.6 F (33.7 C), Max:101.3 F (38.5 C)   Recent Labs Lab 12/25/2016 2231  01/12/17 1158 01/12/17 1435 01/13/17 0524 01/14/17 0518 01/15/17 0432 01/16/17 0411 01/17/17 0419  WBC 9.3  --   --  7.0 8.5  --  6.4  --  7.5  CREATININE 0.42*  < >  --  0.36* 0.40* 0.57 0.59 0.49 0.57  LATICACIDVEN  --   --  0.9 0.9  --   --   --   --   --   < > = values in this interval not displayed.  Estimated Creatinine Clearance: 47.9 mL/min (by C-G formula based on SCr of 0.57 mg/dL).    Allergies  Allergen Reactions  . Codeine Nausea And Vomiting  . Penicillins Nausea And Vomiting    Has patient had a PCN reaction causing immediate rash, facial/tongue/throat swelling, SOB or lightheadedness with hypotension: Yes Has patient had a PCN reaction causing severe rash involving mucus membranes or skin necrosis: No Has patient had a PCN reaction that required hospitalization: No Has patient had a PCN reaction occurring within the last 10 years: No If all of the above answers are "NO", then may proceed with Cephalosporin use.   Antimicrobials this admission: Vancomycin 10/29>> 11/2 Cefepime 10/29 >> Ceftriaxone 1gm and azithromycin 500mg  IV x 1 10/29   Dose adjustments this admission: N/A  Microbiology results: 10/29 BCx: ngtd 10/29 MRSA PCR: (-) 10/31 UC>>proteus  Thank you for allowing pharmacy to be a part of this patient's care.   Talbert CageLora Bren Steers, PharmD Clinical Pharmacist 01/17/2017

## 2017-01-18 LAB — GLUCOSE, CAPILLARY
GLUCOSE-CAPILLARY: 109 mg/dL — AB (ref 65–99)
GLUCOSE-CAPILLARY: 142 mg/dL — AB (ref 65–99)
GLUCOSE-CAPILLARY: 129 mg/dL — AB (ref 65–99)
Glucose-Capillary: 125 mg/dL — ABNORMAL HIGH (ref 65–99)

## 2017-01-18 MED ORDER — IPRATROPIUM-ALBUTEROL 0.5-2.5 (3) MG/3ML IN SOLN
3.0000 mL | Freq: Two times a day (BID) | RESPIRATORY_TRACT | Status: DC
  Administered 2017-01-18 – 2017-01-19 (×3): 3 mL via RESPIRATORY_TRACT
  Filled 2017-01-18 (×3): qty 3

## 2017-01-18 NOTE — Plan of Care (Signed)
Discussed with family and patient about aspiration precautions and needing to have head in high fowlers when eating.

## 2017-01-18 NOTE — Progress Notes (Signed)
**Note De-Identified  Obfuscation** Patient oxygen saturation in low 80s due to increased secretions and inability to take a deep breath and clear with cough. NTS done with small amount of yellow sputum removed.  Patient placed on 12 L HFNC; SAT 100%.  RRT to continue to monitor.

## 2017-01-18 NOTE — Progress Notes (Signed)
PROGRESS NOTE    [name]  ZOX:[id number] DOB: 1925-03-23 DOA: 12/19/2016 PCP: Benita Stabile, MD     Brief Narrative:  Patient is an 83 year old woman admitted from home on 10/28 due to garbled speech, decreased mentation, decreased oral intake and confusion.  The patient at baseline lives alone and is wheelchair-bound but son visits frequently.  Patient was found to have a UTI, possibly CVA, possibly pneumonia with signs of sepsis and admission requested.  DNR as of 10/31. No significant changes overnight 10/31-11/1.  As of 11/2 she has her eyes open, however continues with  nonsensical moaning and groaning.No significant change in overall status as of 11/4.   Assessment & Plan:   Principal Problem:   Severe sepsis with septic shock (HCC) Active Problems:   Diabetes type 2, controlled (HCC)   CVA (cerebral vascular accident) (HCC)   UTI (urinary tract infection)   Altered mental status   Pressure ulcer of buttock   Sepsis (HCC)   Hypothermia   Multifocal pneumonia   Septic shock -Weaned off norepinephrine as of 11/1. -She remains hypothermic at times. -Source is urine, plus minus pneumonia.  Urosepsis -Bblood cultures remain negative.  -Culture from 10/31 shows Proteus resistant only to Macrobid.    -Continue cefepime for now.  Community-acquired pneumonia -This diagnosis is not entirely clear, initial chest x-ray with maybe a basilar infiltrate but subsequent x-rays look more like pulmonary edema. -Nonetheless remains on broad-spectrum antibiotic therapy and culture data has been requested.  Hypokalemia -Replaced  Acute hypoxemic respiratory failure -Patient is requiring high amount of oxygen, believe she has pulmonary edema due to aggressive fluid resuscitation given sepsis coupled with copious mucus secretions.   -Back on HF [place] oxygen today.  Sacral/heel pressure ulcers -Present prior to admission, continue air mattress, pressure boots  Metabolic/septic  encephalopathy -Due to ongoing infections, would hope for improvement, however given acute CVA will need to see how much she can improve. -As of 11/1 remains with moaning and groaning without intelligible speech.  Acute CVA -As per MRI.  Continue supportive therapy, rectal aspirin.   DVT prophylaxis: Lovenox Code Status: DNR Family Communication: Son at bedside updated on plan of care and all questions answered on 10/31. Disposition Plan: Will request palliative consultation for 11/5. Overall poor prognosis. No PO intake of significance since admission.  Consultants:   Neurology  Procedures:   None  Antimicrobials:  Anti-infectives (From admission, onward)   Start     Dose/Rate Route Frequency Ordered Stop   01/13/17 1100  azithromycin (ZITHROMAX) 250 mg in dextrose 5 % 125 mL IVPB  Status:  Discontinued     250 mg 125 mL/hr over 60 Minutes Intravenous  Once 01/12/17 1051 01/12/17 1102   01/13/17 1100  azithromycin (ZITHROMAX) 250 mg in dextrose 5 % 125 mL IVPB  Status:  Discontinued     250 mg 125 mL/hr over 60 Minutes Intravenous Every 24 hours 01/12/17 1102 01/12/17 1214   01/13/17 1000  vancomycin (VANCOCIN) IVPB 750 mg/150 ml premix  Status:  Discontinued     750 mg 150 mL/hr over 60 Minutes Intravenous Every 12 hours 01/13/17 0744 01/16/17 1314   01/12/17 1300  ceFEPIme (MAXIPIME) 2 g in dextrose 5 % 50 mL IVPB     2 g 100 mL/hr over 30 Minutes Intravenous Every 24 hours 01/12/17 1246     01/12/17 1230  levofloxacin (LEVAQUIN) IVPB 750 mg  Status:  Discontinued     750 mg 100 mL/hr over 90  Minutes Intravenous  Once 01/12/17 1216 01/12/17 1242   01/12/17 1230  aztreonam (AZACTAM) 2 g in dextrose 5 % 50 mL IVPB  Status:  Discontinued     2 g 100 mL/hr over 30 Minutes Intravenous  Once 01/12/17 1216 01/12/17 1242   01/12/17 1230  vancomycin (VANCOCIN) IVPB 1000 mg/200 mL premix     1,000 mg 200 mL/hr over 60 Minutes Intravenous  Once 01/12/17 1216 01/12/17 1630    01/12/17 1100  azithromycin (ZITHROMAX) 500 mg in dextrose 5 % 250 mL IVPB  Status:  Discontinued     500 mg 250 mL/hr over 60 Minutes Intravenous  Once 01/12/17 1051 01/12/17 1253   01/12/17 0115  cefTRIAXone (ROCEPHIN) 1 g in dextrose 5 % 50 mL IVPB  Status:  Discontinued     1 g 100 mL/hr over 30 Minutes Intravenous Every 24 hours 01/12/17 0109 01/12/17 1214       Subjective: Unresponsive, occasional moaning and groaning  Objective: Vitals:   01/18/17 1100 01/18/17 1200 01/18/17 1300 01/18/17 1400  BP: (!) 150/87 (!) 157/95 (!) 144/93 (!) 172/95  Pulse: 89 87 85 98  Resp: (!) 24 (!) 24 (!) 21 (!) 23  Temp: 98.8 F (37.1 C)     TempSrc: Oral     SpO2: 100% 100% 99% 100%  Weight:      Height:        Intake/Output Summary (Last 24 hours) at 01/18/2017 1508 Last data filed at 01/18/2017 1400 Gross per 24 hour  Intake 1650 ml  Output 1550 ml  Net 100 ml   Filed Weights   01/15/17 0500 01/16/17 0500 01/18/17 0500  Weight: 76.7 kg (169 lb) 77.1 kg (170 lb) 78.9 kg (174 lb)    Examination:   General exam: Not responsive today Respiratory system: Coarse bilateral breath sounds Cardiovascular system:RRR. No murmurs, rubs, gallops. Gastrointestinal system: Abdomen is nondistended, soft and nontender. No organomegaly or masses felt. Normal bowel sounds heard. Central nervous system: Unable to fully assess given current mental state Extremities: With bilateral heel boots and cleanly wrapped in Ace bandage Psychiatry: Unable to assess given current mental state      Data Reviewed: I have personally reviewed following labs and imaging studies  CBC: Recent Labs  Lab 2017-02-10 2231 01/12/17 1435 01/13/17 0524 01/15/17 0432 01/17/17 0419  WBC 9.3 7.0 8.5 6.4 7.5  NEUTROABS 8.4*  --  7.7  --   --   HGB 11.9* 11.0* 11.1* 10.1* 10.0*  HCT 36.7 34.9* 35.2* 32.1* 31.0*  MCV 94.3 95.1 95.4 95.0 93.1  PLT 181 160 171 121* 80*   Basic Metabolic Panel: Recent Labs  Lab  01/13/17 0524 01/14/17 0518 01/15/17 0432 01/16/17 0411 01/17/17 0419  NA 135 140 139 139 139  K 3.7 3.7 3.5 3.3* 3.5  CL 106 108 106 105 104  CO2 26 28 27 27 28   GLUCOSE 87 92 93 105* 106*  BUN 7 7 7 7 6   CREATININE 0.40* 0.57 0.59 0.49 0.57  CALCIUM 8.2* 8.0* 8.3* 8.4* 8.5*   GFR: Estimated Creatinine Clearance: 48.5 mL/min (by C-G formula based on SCr of 0.57 mg/dL). Liver Function Tests: Recent Labs  Lab 01/12/17 0518 01/12/17 1435 01/13/17 0524  AST 35 48* 49*  ALT 25 34 37  ALKPHOS 78 84 83  BILITOT 0.5 0.6 0.4  PROT 6.4* 6.0* 6.0*  ALBUMIN 2.8* 2.7* 2.5*   No results for input(s): LIPASE, AMYLASE in the last 168 hours. No results for input(s):  AMMONIA in the last 168 hours. Coagulation Profile: Recent Labs  Lab 01/12/17 1435  INR 1.30   Cardiac Enzymes: Recent Labs  Lab 01/12/17 1435  TROPONINI <0.03   BNP (last 3 results) No results for input(s): PROBNP in the last 8760 hours. HbA1C: No results for input(s): HGBA1C in the last 72 hours. CBG: Recent Labs  Lab 01/17/17 1123 01/17/17 1625 01/17/17 2153 01/18/17 0759 01/18/17 1127  GLUCAP 114* 129* 117* 109* 142*   Lipid Profile: No results for input(s): CHOL, HDL, LDLCALC, TRIG, CHOLHDL, LDLDIRECT in the last 72 hours. Thyroid Function Tests: No results for input(s): TSH, T4TOTAL, FREET4, T3FREE, THYROIDAB in the last 72 hours. Anemia Panel: No results for input(s): VITAMINB12, FOLATE, FERRITIN, TIBC, IRON, RETICCTPCT in the last 72 hours. Urine analysis:    Component Value Date/Time   COLORURINE YELLOW 01/08/2017 0005   APPEARANCEUR HAZY (A) 12/30/2016 0005   LABSPEC 1.020 12/25/2016 0005   PHURINE 7.0 12/17/2016 0005   GLUCOSEU NEGATIVE 12/15/2016 0005   HGBUR MODERATE (A) 01/08/2017 0005   BILIRUBINUR NEGATIVE 01/02/2017 0005   KETONESUR NEGATIVE 12/29/2016 0005   PROTEINUR 100 (A) 12/23/2016 0005   UROBILINOGEN 1.0 05/25/2014 1800   NITRITE POSITIVE (A) 01/08/2017 0005    LEUKOCYTESUR LARGE (A) 01/04/2017 0005   Sepsis Labs: @LABRCNTIP (procalcitonin:4,lacticidven:4)  ) Recent Results (from the past 240 hour(s))  MRSA PCR Screening     Status: None   Collection Time: 01/12/17 12:50 PM  Result Value Ref Range Status   MRSA by PCR NEGATIVE NEGATIVE Final    Comment:        The GeneXpert MRSA Assay (FDA approved for NASAL specimens only), is one component of a comprehensive MRSA colonization surveillance program. It is not intended to diagnose MRSA infection nor to guide or monitor treatment for MRSA infections.   Culture, blood (x 2)     Status: None   Collection Time: 01/12/17  2:35 PM  Result Value Ref Range Status   Specimen Description LEFT ANTECUBITAL  Final   Special Requests   Final    BOTTLES DRAWN AEROBIC AND ANAEROBIC Blood Culture results may not be optimal due to an excessive volume of blood received in culture bottles   Culture NO GROWTH 5 DAYS  Final   Report Status 01/17/2017 FINAL  Final  Culture, blood (x 2)     Status: None   Collection Time: 01/12/17  2:35 PM  Result Value Ref Range Status   Specimen Description BLOOD LEFT WRIST  Final   Special Requests   Final    BOTTLES DRAWN AEROBIC ONLY Blood Culture adequate volume   Culture NO GROWTH 5 DAYS  Final   Report Status 01/17/2017 FINAL  Final  Culture, Urine     Status: Abnormal   Collection Time: 01/14/17  5:54 PM  Result Value Ref Range Status   Specimen Description URINE, CATHETERIZED  Final   Special Requests NONE  Final   Culture >=100,000 COLONIES/mL PROTEUS MIRABILIS (A)  Final   Report Status 01/17/2017 FINAL  Final   Organism ID, Bacteria PROTEUS MIRABILIS (A)  Final      Susceptibility   Proteus mirabilis - MIC*    AMPICILLIN <=2 SENSITIVE Sensitive     CEFAZOLIN <=4 SENSITIVE Sensitive     CEFTRIAXONE <=1 SENSITIVE Sensitive     CIPROFLOXACIN <=0.25 SENSITIVE Sensitive     GENTAMICIN <=1 SENSITIVE Sensitive     IMIPENEM <=0.25 SENSITIVE Sensitive      NITROFURANTOIN >=512 RESISTANT Resistant  TRIMETH/SULFA <=20 SENSITIVE Sensitive     AMPICILLIN/SULBACTAM <=2 SENSITIVE Sensitive     PIP/TAZO <=4 SENSITIVE Sensitive     * >=100,000 COLONIES/mL PROTEUS MIRABILIS         Radiology Studies: No results found.      Scheduled Meds: .  stroke: mapping our early stages of recovery book   Does not apply Once  . aspirin  300 mg Rectal Daily   Or  . aspirin  325 mg Oral Daily  . chlorhexidine  15 mL Mouth Rinse BID  . collagenase   Topical Daily  . enoxaparin (LOVENOX) injection  40 mg Subcutaneous Q24H  . feeding supplement (ENSURE ENLIVE)  237 mL Oral BID BM  . feeding supplement (PRO-STAT SUGAR FREE 64)  30 mL Oral BID  . hydrocerin   Topical BID  . Influenza vac split quadrivalent PF  0.5 mL Intramuscular Tomorrow-1000  . ipratropium-albuterol  3 mL Nebulization BID  . mouth rinse  15 mL Mouth Rinse q12n4p  . pneumococcal 23 valent vaccine  0.5 mL Intramuscular Tomorrow-1000   Continuous Infusions: . ceFEPime (MAXIPIME) IV Stopped (01/18/17 1317)  . dextrose 5 % and 0.9% NaCl 75 mL/hr at 01/17/17 1746  . norepinephrine (LEVOPHED) Adult infusion Stopped (01/16/17 0648)     LOS: 6 days    Time spent: 25 minutes. Greater than 50% of this time was spent in direct contact with the patient coordinating care.     Chaya Jan, MD [facility] Pager [phone]  If 7PM-7AM, please contact night-coverage [email or web address] Password [email or web address] 01/18/2017, 3:08 PM

## 2017-01-19 ENCOUNTER — Encounter (HOSPITAL_COMMUNITY): Payer: Self-pay | Admitting: Primary Care

## 2017-01-19 ENCOUNTER — Other Ambulatory Visit: Payer: Self-pay | Admitting: *Deleted

## 2017-01-19 ENCOUNTER — Other Ambulatory Visit: Payer: Self-pay

## 2017-01-19 DIAGNOSIS — Z515 Encounter for palliative care: Secondary | ICD-10-CM

## 2017-01-19 DIAGNOSIS — Z7189 Other specified counseling: Secondary | ICD-10-CM

## 2017-01-19 LAB — GLUCOSE, CAPILLARY
GLUCOSE-CAPILLARY: 121 mg/dL — AB (ref 65–99)
GLUCOSE-CAPILLARY: 143 mg/dL — AB (ref 65–99)
GLUCOSE-CAPILLARY: 158 mg/dL — AB (ref 65–99)
Glucose-Capillary: 156 mg/dL — ABNORMAL HIGH (ref 65–99)

## 2017-01-19 LAB — BASIC METABOLIC PANEL
ANION GAP: 9 (ref 5–15)
BUN: 6 mg/dL (ref 6–20)
CALCIUM: 8.2 mg/dL — AB (ref 8.9–10.3)
CO2: 31 mmol/L (ref 22–32)
Chloride: 102 mmol/L (ref 101–111)
Creatinine, Ser: 0.45 mg/dL (ref 0.44–1.00)
GFR calc Af Amer: >60 mL/min (ref >60)
GLUCOSE: 141 mg/dL — AB (ref 65–99)
Potassium: 2.6 mmol/L — CL (ref 3.5–5.1)
SODIUM: 142 mmol/L (ref 135–145)

## 2017-01-19 LAB — PHOSPHORUS: Phosphorus: 2 mg/dL — ABNORMAL LOW (ref 2.5–4.6)

## 2017-01-19 LAB — MAGNESIUM: MAGNESIUM: 1.5 mg/dL — AB (ref 1.7–2.4)

## 2017-01-19 MED ORDER — FUROSEMIDE 10 MG/ML IJ SOLN: 40.0000 mg | Freq: Every day | INTRAMUSCULAR | Status: DC

## 2017-01-19 MED ORDER — LABETALOL HCL 5 MG/ML IV SOLN
5.0000 mg | Freq: Four times a day (QID) | INTRAVENOUS | Status: DC | PRN
  Administered 2017-01-19: 5 mg via INTRAVENOUS
  Filled 2017-01-19: qty 4

## 2017-01-19 MED ORDER — MAGNESIUM SULFATE 2 GM/50ML IV SOLN
2.0000 g | Freq: Once | INTRAVENOUS | Status: AC
  Administered 2017-01-19: 2 g via INTRAVENOUS
  Filled 2017-01-19: qty 50

## 2017-01-19 MED ORDER — POTASSIUM CHLORIDE 10 MEQ/100ML IV SOLN
INTRAVENOUS | Status: AC
  Administered 2017-01-19: 10 meq
  Filled 2017-01-19: qty 100

## 2017-01-19 MED ORDER — POTASSIUM CHLORIDE 10 MEQ/100ML IV SOLN
INTRAVENOUS | Status: AC
Start: 2017-01-19 — End: 2017-01-19
  Administered 2017-01-19: 10 meq
  Filled 2017-01-19: qty 100

## 2017-01-19 MED ORDER — POTASSIUM PHOSPHATES 15 MMOLE/5ML IV SOLN
20.0000 mmol | Freq: Once | INTRAVENOUS | Status: AC
  Administered 2017-01-19: 20 mmol via INTRAVENOUS
  Filled 2017-01-19: qty 6.67

## 2017-01-19 MED ORDER — POTASSIUM CHLORIDE 10 MEQ/100ML IV SOLN
10.0000 meq | INTRAVENOUS | Status: AC
  Administered 2017-01-19 (×6): 10 meq via INTRAVENOUS
  Filled 2017-01-19 (×5): qty 100

## 2017-01-19 MED ORDER — POTASSIUM CHLORIDE 10 MEQ/100ML IV SOLN
INTRAVENOUS | Status: AC
  Filled 2017-01-19: qty 100

## 2017-01-19 MED ORDER — MORPHINE SULFATE (PF) 2 MG/ML IV SOLN
1.0000 mg | INTRAVENOUS | Status: DC | PRN
  Administered 2017-01-19 (×3): 2 mg via INTRAVENOUS
  Filled 2017-01-19 (×3): qty 1

## 2017-01-19 MED ORDER — FUROSEMIDE 10 MG/ML IJ SOLN
40.0000 mg | Freq: Once | INTRAMUSCULAR | Status: AC
  Administered 2017-01-19: 40 mg via INTRAVENOUS
  Filled 2017-01-19: qty 4

## 2017-01-19 NOTE — Progress Notes (Signed)
Patient's BP noted reading high x 3 times consecutively, MD notified and new order given for Labetalol 5mg  IV push Q6H PRN for SBP > 175
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
## 2017-01-19 NOTE — Patient Outreach (Signed)
[facility] Care Management  01/19/2017  Orvan [facility] 1925/11/08 [id number]  I reviewed [name]'s progress via medical record review today. I'm sorry to see that her health has continued to decline. It appears that [name]'s respiratory status is declining. Appreciate the engagement by Peggye LeyNatasha Dove, NP from the Palliative Medicine Team with [name]'s son, [name]. Mr. [name] agreed to DNR status, and the use of BIPAP and/or pressors if needed with an end goal of re-evaluation of her status and progress over the next 24-48 hours.   I reached out to [name] by phone this afternoon but was unable to reach him. I left a HIPPA compliant message, leaving my contact info, and inviting Mr. [name] to call me should he need any assistance from us.   Plan: I will follow [name]'s progress and continue to try to reach her son in an effort to provide support to him.    [name] MHA,BSN,RN,CCM [facility] Care Management  [phone]

## 2017-01-19 NOTE — Progress Notes (Signed)
Patient noted coughing during lunch while her son was feeding her. Son advised that patient wasn't swallowing her food/drink safely & that is was probably best to not feed her anymore at this time. Food noted pocketed in patient's mouth, mouth care & suction provided to removed food particles. Patient noted coughing and lung sounds noted more coarse.

## 2017-01-19 NOTE — Progress Notes (Signed)
PROGRESS NOTE    [name]  WUJ:[id number] DOB: 05-31-1925 DOA: 01/04/2017 PCP: Benita Stabile, MD     Brief Narrative:  Patient is an 83 year old woman admitted from home on 10/28 due to garbled speech, decreased mentation, decreased oral intake and confusion.  The patient at baseline lives alone and is wheelchair-bound but son visits frequently.  Patient was found to have a UTI, possibly CVA, possibly pneumonia with signs of sepsis and admission requested.  DNR as of 10/31. No significant changes overnight 10/31-11/1.  As of 11/2 she has her eyes open, however continues with  nonsensical moaning and groaning.No significant change in overall status as of 11/5.   Assessment & Plan:   Principal Problem:   Severe sepsis with septic shock (HCC) Active Problems:   Diabetes type 2, controlled (HCC)   CVA (cerebral vascular accident) (HCC)   UTI (urinary tract infection)   Altered mental status   Pressure ulcer of buttock   Sepsis (HCC)   Hypothermia   Multifocal pneumonia   Palliative care encounter   Goals of care, counseling/discussion   DNR (do not resuscitate) discussion   Septic shock -Weaned off norepinephrine as of 11/1. -She remains hypothermic at times. -Source is urine, plus minus pneumonia.  Urosepsis -Bblood cultures remain negative.  -Culture from 10/31 shows Proteus resistant only to Macrobid.    -Continue cefepime for now.  Community-acquired pneumonia -This diagnosis is not entirely clear, initial chest x-ray with maybe a basilar infiltrate but subsequent x-rays look more like pulmonary edema. -Nonetheless remains on broad-spectrum antibiotic therapy and culture data has been requested.  Hypokalemia -Replaced  Acute hypoxemic respiratory failure -Patient is requiring high amount of oxygen, believe she has pulmonary edema due to aggressive fluid resuscitation given sepsis coupled with copious mucus secretions.   -Back on HF [place] oxygen today. -Will give  one time dose of IV lasix today.  Sacral/heel pressure ulcers -Present prior to admission, continue air mattress, pressure boots  Metabolic/septic encephalopathy -Due to ongoing infections, would hope for improvement, however given acute CVA will need to see how much she can improve. -As of 11/1 remains with moaning and groaning without intelligible speech.  Acute CVA -As per MRI.  Continue supportive therapy, rectal aspirin.   DVT prophylaxis: Lovenox Code Status: DNR Family Communication: Son at bedside updated on plan of care and all questions answered on 10/31. Disposition Plan: Will request palliative consultation for 11/5. Overall poor prognosis. No PO intake of significance since admission.  Consultants:   Neurology  Procedures:   None  Antimicrobials:  Anti-infectives (From admission, onward)   Start     Dose/Rate Route Frequency Ordered Stop   01/13/17 1100  azithromycin (ZITHROMAX) 250 mg in dextrose 5 % 125 mL IVPB  Status:  Discontinued     250 mg 125 mL/hr over 60 Minutes Intravenous  Once 01/12/17 1051 01/12/17 1102   01/13/17 1100  azithromycin (ZITHROMAX) 250 mg in dextrose 5 % 125 mL IVPB  Status:  Discontinued     250 mg 125 mL/hr over 60 Minutes Intravenous Every 24 hours 01/12/17 1102 01/12/17 1214   01/13/17 1000  vancomycin (VANCOCIN) IVPB 750 mg/150 ml premix  Status:  Discontinued     750 mg 150 mL/hr over 60 Minutes Intravenous Every 12 hours 01/13/17 0744 01/16/17 1314   01/12/17 1300  ceFEPIme (MAXIPIME) 2 g in dextrose 5 % 50 mL IVPB     2 g 100 mL/hr over 30 Minutes Intravenous Every 24 hours 01/12/17  1246     01/12/17 1230  levofloxacin (LEVAQUIN) IVPB 750 mg  Status:  Discontinued     750 mg 100 mL/hr over 90 Minutes Intravenous  Once 01/12/17 1216 01/12/17 1242   01/12/17 1230  aztreonam (AZACTAM) 2 g in dextrose 5 % 50 mL IVPB  Status:  Discontinued     2 g 100 mL/hr over 30 Minutes Intravenous  Once 01/12/17 1216 01/12/17 1242    01/12/17 1230  vancomycin (VANCOCIN) IVPB 1000 mg/200 mL premix     1,000 mg 200 mL/hr over 60 Minutes Intravenous  Once 01/12/17 1216 01/12/17 1630   01/12/17 1100  azithromycin (ZITHROMAX) 500 mg in dextrose 5 % 250 mL IVPB  Status:  Discontinued     500 mg 250 mL/hr over 60 Minutes Intravenous  Once 01/12/17 1051 01/12/17 1253   01/12/17 0115  cefTRIAXone (ROCEPHIN) 1 g in dextrose 5 % 50 mL IVPB  Status:  Discontinued     1 g 100 mL/hr over 30 Minutes Intravenous Every 24 hours 01/12/17 0109 01/12/17 1214       Subjective: A little more alert today. Still without significant PO intake.  Objective: Vitals:   01/19/17 1352 01/19/17 1400 01/19/17 1600 01/19/17 1606  BP:  122/77 (!) 147/86   Pulse: 69 78 76 77  Resp: (!) 24 18 20  (!) 22  Temp:      TempSrc:      SpO2: 90% 92% 92% 94%  Weight:      Height:        Intake/Output Summary (Last 24 hours) at 01/19/2017 1743 Last data filed at 01/19/2017 0902 Gross per 24 hour  Intake 1430 ml  Output 1250 ml  Net 180 ml   Filed Weights   01/16/17 0500 01/18/17 0500 01/19/17 0500  Weight: 77.1 kg (170 lb) 78.9 kg (174 lb) 77.1 kg (170 lb)    Examination:   General exam: Not responsive today Respiratory system: Coarse bilateral breath sounds Cardiovascular system:RRR. No murmurs, rubs, gallops. Gastrointestinal system: Abdomen is nondistended, soft and nontender. No organomegaly or masses felt. Normal bowel sounds heard. Central nervous system: Unable to fully assess given current mental state Extremities: With bilateral heel boots and cleanly wrapped in Ace bandage Psychiatry: Unable to assess given current mental state      Data Reviewed: I have personally reviewed following labs and imaging studies  CBC: Recent Labs  Lab 01/13/17 0524 01/15/17 0432 01/17/17 0419  WBC 8.5 6.4 7.5  NEUTROABS 7.7  --   --   HGB 11.1* 10.1* 10.0*  HCT 35.2* 32.1* 31.0*  MCV 95.4 95.0 93.1  PLT 171 121* 80*   Basic Metabolic  Panel: Recent Labs  Lab 01/14/17 0518 01/15/17 0432 01/16/17 0411 01/17/17 0419 01/19/17 0400 01/19/17 0539  NA 140 139 139 139 142  --   K 3.7 3.5 3.3* 3.5 2.6*  --   CL 108 106 105 104 102  --   CO2 28 27 27 28 31   --   GLUCOSE 92 93 105* 106* 141*  --   BUN 7 7 7 6 6   --   CREATININE 0.57 0.59 0.49 0.57 0.45  --   CALCIUM 8.0* 8.3* 8.4* 8.5* 8.2*  --   MG  --   --   --   --   --  1.5*  PHOS  --   --   --   --   --  2.0*   GFR: Estimated Creatinine Clearance: 47.9 mL/min (by  C-G formula based on SCr of 0.45 mg/dL). Liver Function Tests: Recent Labs  Lab 01/13/17 0524  AST 49*  ALT 37  ALKPHOS 83  BILITOT 0.4  PROT 6.0*  ALBUMIN 2.5*   No results for input(s): LIPASE, AMYLASE in the last 168 hours. No results for input(s): AMMONIA in the last 168 hours. Coagulation Profile: No results for input(s): INR, PROTIME in the last 168 hours. Cardiac Enzymes: No results for input(s): CKTOTAL, CKMB, CKMBINDEX, TROPONINI in the last 168 hours. BNP (last 3 results) No results for input(s): PROBNP in the last 8760 hours. HbA1C: No results for input(s): HGBA1C in the last 72 hours. CBG: Recent Labs  Lab 01/18/17 1644 01/18/17 2134 01/19/17 0731 01/19/17 1112 01/19/17 1737  GLUCAP 129* 125* 121* 156* 143*   Lipid Profile: No results for input(s): CHOL, HDL, LDLCALC, TRIG, CHOLHDL, LDLDIRECT in the last 72 hours. Thyroid Function Tests: No results for input(s): TSH, T4TOTAL, FREET4, T3FREE, THYROIDAB in the last 72 hours. Anemia Panel: No results for input(s): VITAMINB12, FOLATE, FERRITIN, TIBC, IRON, RETICCTPCT in the last 72 hours. Urine analysis:    Component Value Date/Time   COLORURINE YELLOW January 12, 2017 0005   APPEARANCEUR HAZY (A) Jan 12, 2017 0005   LABSPEC 1.020 12-Jan-2017 0005   PHURINE 7.0 01-12-2017 0005   GLUCOSEU NEGATIVE 01/12/2017 0005   HGBUR MODERATE (A) 01-12-17 0005   BILIRUBINUR NEGATIVE Jan 12, 2017 0005   KETONESUR NEGATIVE 2017-01-12 0005    PROTEINUR 100 (A) 2017/01/12 0005   UROBILINOGEN 1.0 05/25/2014 1800   NITRITE POSITIVE (A) 01/12/17 0005   LEUKOCYTESUR LARGE (A) Jan 12, 2017 0005   Sepsis Labs: @LABRCNTIP (procalcitonin:4,lacticidven:4)  ) Recent Results (from the past 240 hour(s))  MRSA PCR Screening     Status: None   Collection Time: 01/12/17 12:50 PM  Result Value Ref Range Status   MRSA by PCR NEGATIVE NEGATIVE Final    Comment:        The GeneXpert MRSA Assay (FDA approved for NASAL specimens only), is one component of a comprehensive MRSA colonization surveillance program. It is not intended to diagnose MRSA infection nor to guide or monitor treatment for MRSA infections.   Culture, blood (x 2)     Status: None   Collection Time: 01/12/17  2:35 PM  Result Value Ref Range Status   Specimen Description LEFT ANTECUBITAL  Final   Special Requests   Final    BOTTLES DRAWN AEROBIC AND ANAEROBIC Blood Culture results may not be optimal due to an excessive volume of blood received in culture bottles   Culture NO GROWTH 5 DAYS  Final   Report Status 01/17/2017 FINAL  Final  Culture, blood (x 2)     Status: None   Collection Time: 01/12/17  2:35 PM  Result Value Ref Range Status   Specimen Description BLOOD LEFT WRIST  Final   Special Requests   Final    BOTTLES DRAWN AEROBIC ONLY Blood Culture adequate volume   Culture NO GROWTH 5 DAYS  Final   Report Status 01/17/2017 FINAL  Final  Culture, Urine     Status: Abnormal   Collection Time: 01/14/17  5:54 PM  Result Value Ref Range Status   Specimen Description URINE, CATHETERIZED  Final   Special Requests NONE  Final   Culture >=100,000 COLONIES/mL PROTEUS MIRABILIS (A)  Final   Report Status 01/17/2017 FINAL  Final   Organism ID, Bacteria PROTEUS MIRABILIS (A)  Final      Susceptibility   Proteus mirabilis - MIC*    AMPICILLIN <=  2 SENSITIVE Sensitive     CEFAZOLIN <=4 SENSITIVE Sensitive     CEFTRIAXONE <=1 SENSITIVE Sensitive     CIPROFLOXACIN  <=0.25 SENSITIVE Sensitive     GENTAMICIN <=1 SENSITIVE Sensitive     IMIPENEM <=0.25 SENSITIVE Sensitive     NITROFURANTOIN >=512 RESISTANT Resistant     TRIMETH/SULFA <=20 SENSITIVE Sensitive     AMPICILLIN/SULBACTAM <=2 SENSITIVE Sensitive     PIP/TAZO <=4 SENSITIVE Sensitive     * >=100,000 COLONIES/mL PROTEUS MIRABILIS         Radiology Studies: No results found.      Scheduled Meds: .  stroke: mapping our early stages of recovery book   Does not apply Once  . aspirin  300 mg Rectal Daily   Or  . aspirin  325 mg Oral Daily  . chlorhexidine  15 mL Mouth Rinse BID  . collagenase   Topical Daily  . enoxaparin (LOVENOX) injection  40 mg Subcutaneous Q24H  . feeding supplement (ENSURE ENLIVE)  237 mL Oral BID BM  . feeding supplement (PRO-STAT SUGAR FREE 64)  30 mL Oral BID  . hydrocerin   Topical BID  . Influenza vac split quadrivalent PF  0.5 mL Intramuscular Tomorrow-1000  . ipratropium-albuterol  3 mL Nebulization BID  . mouth rinse  15 mL Mouth Rinse q12n4p  . pneumococcal 23 valent vaccine  0.5 mL Intramuscular Tomorrow-1000   Continuous Infusions: . ceFEPime (MAXIPIME) IV Stopped (01/19/17 1620)  . dextrose 5 % and 0.9% NaCl 75 mL/hr at 01/19/17 1625  . norepinephrine (LEVOPHED) Adult infusion Stopped (01/16/17 0648)     LOS: 7 days    Time spent: 25 minutes. Greater than 50% of this time was spent in direct contact with the patient coordinating care.     Chaya JanHERNANDEZ ACOSTA,ESTELA, MD [facility] Pager [phone]  If 7PM-7AM, please contact night-coverage [email or web address] Password [email or web address] 01/19/2017, 5:43 PM

## 2017-01-22 ENCOUNTER — Ambulatory Visit: Payer: Medicare Other | Admitting: *Deleted

## 2017-01-22 ENCOUNTER — Other Ambulatory Visit: Payer: Self-pay | Admitting: *Deleted

## 2017-01-22 NOTE — Patient Outreach (Signed)
[facility] Care Management  01/22/2017  Orvan SeenRuth J Melder 1925/04/29 [id number]  I regret to hear of [name]'s passing on 02/05/2017 at [facility]. I have reached out to her family to provide support and express our condolences. We are grateful to have had the opportunity to participate in Mrs. Casteneda's care.    Marja Kayslisa Gilboy MHA,BSN,RN,CCM [facility]  [phone]

## 2017-01-23 ENCOUNTER — Ambulatory Visit: Payer: Self-pay | Admitting: *Deleted

## 2017-02-14 NOTE — Progress Notes (Signed)
Family left and was very thankful and appreciative of all the care given to patient. Pt body prepared and transferred down to [facility]ue awaiting tissue donation.

## 2017-02-14 NOTE — Progress Notes (Signed)
At 0314 patients O2 saturation dropped to mid 80s. Went to check on patient to make sure she had not pulled NRB off and she had not. Tried to wake patient and pt would not respond. Repositioned pt to try and arouse her with no response. Audible rhonchi heard. Suctioned orally and RT was called to NTS pt. Even after NTS there was no change in vitals. Pts son Brayton CavesJessie was called and made aware of decline if he wanted to come in. BP, HR and O2 saturation all continued to slowly decline until pt passed peacefully at [phone]. Awaiting arrival of son.

## 2017-02-14 NOTE — Progress Notes (Signed)
Nasally suctioned pt. Pt tolerated well. Audible rhonchi before suction. Moderate amount of thick yellow, white secretions removed.

## 2017-02-14 DEATH — deceased

## 2017-03-17 NOTE — Discharge Summary (Signed)
Death summary  Patient was an 84 year old woman initially admitted to the hospital on 10/28 due to confusion and garbled speech.  She subsequently developed severe sepsis with septic shock and was pressor dependent.  She was thought to have a UTI as well as community-acquired pneumonia.  Also MRI showed evidence for acute CVA.  She was a DNR.  She was determined to have poor prognosis and family had been kept up to date.  In the early morning hours of 11/6 nurse noted her oxygen saturations to drop into the mid 80s.  She was suctioned however she failed to improve.  Blood pressure, heart rate and oxygenation all continued to slowly decline until patient passed peacefully at 0337.  Son was informed and was at bedside.  Causes of death:  Severe sepsis with septic shock UTI Community-acquired pneumonia Acute CVA Acute hypoxemic respiratory failure  Peggye PittEstela Hernandez, MD [facility] Pager: [phone]

## 2018-03-17 IMAGING — CT CT HEAD W/O CM
3 series · 15 of 46 positions shown, 18 images · non-contrast
Comparison: None.

CLINICAL DATA: 89-year-old female with altered mental status.

EXAM:
CT HEAD WITHOUT CONTRAST
TECHNIQUE: Contiguous axial images were obtained from the base of the skull
through the vertex without intravenous contrast.

[Series 2: head wo · axial · 0.43mm/px · z∈[-80,+40]mm · 9 of 29 slices shown, 12 images]
[im 3/29  brain]
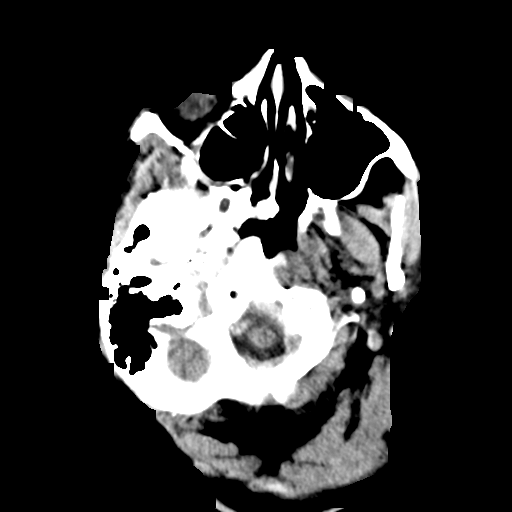
[im 3/29  bone]
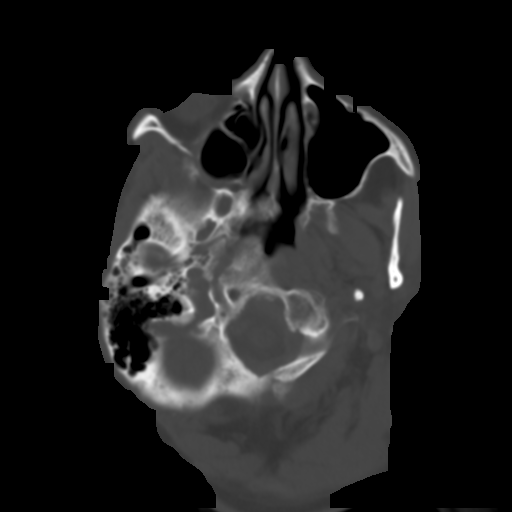
[im 6/29  brain]
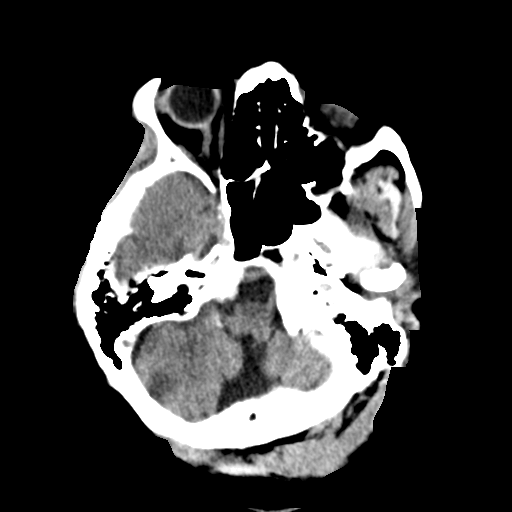
[im 9/29  brain]
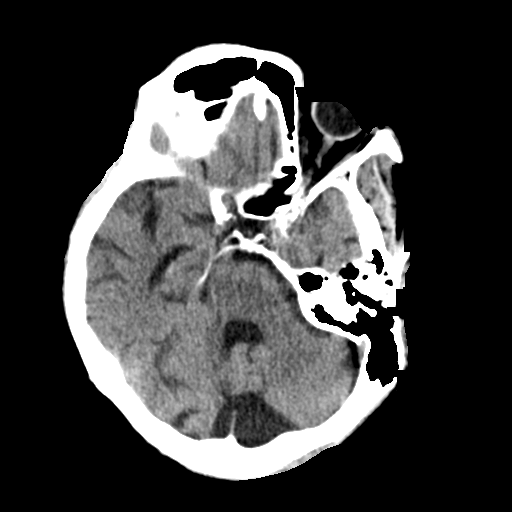
[im 12/29  brain]
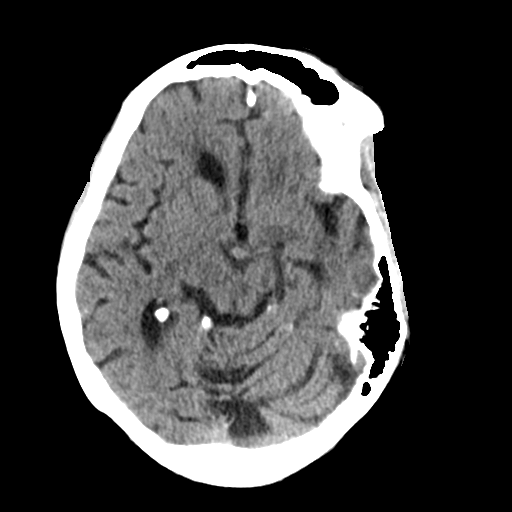
[im 15/29  brain]
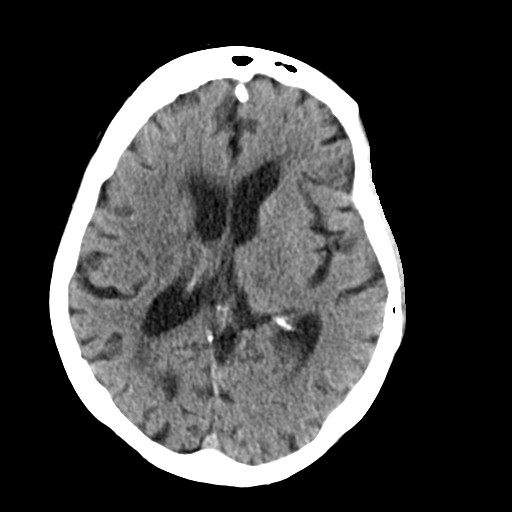
[im 15/29  bone]
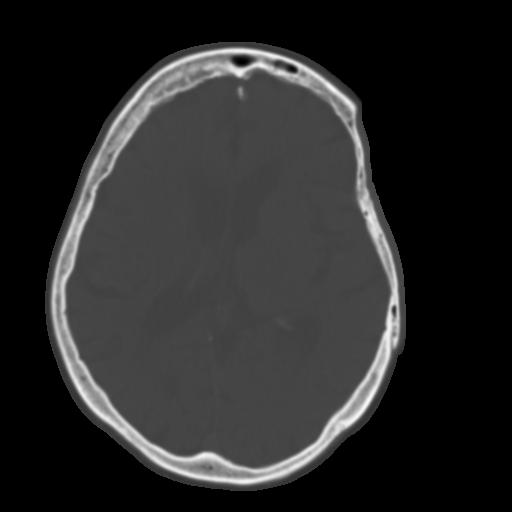
[im 18/29  brain]
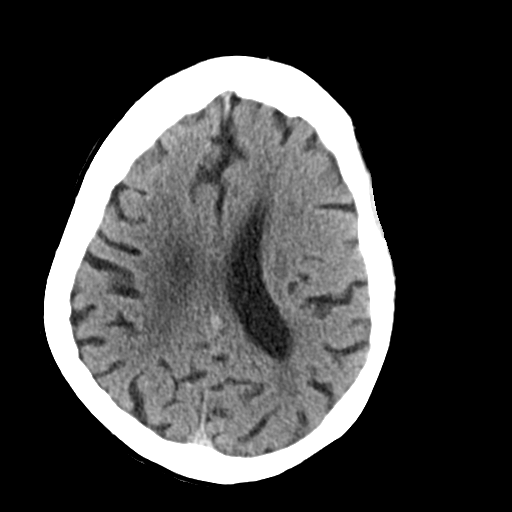
[im 21/29  brain]
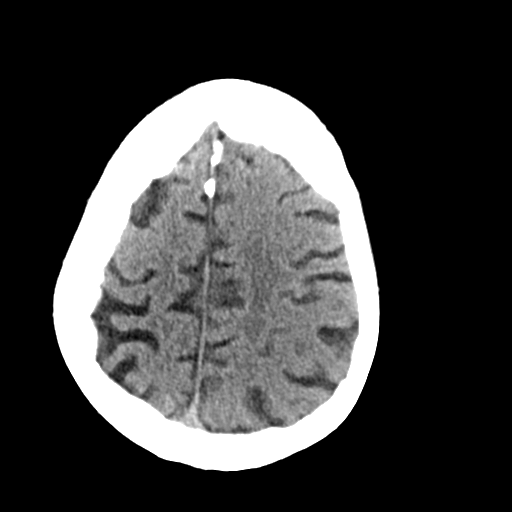
[im 24/29  brain]
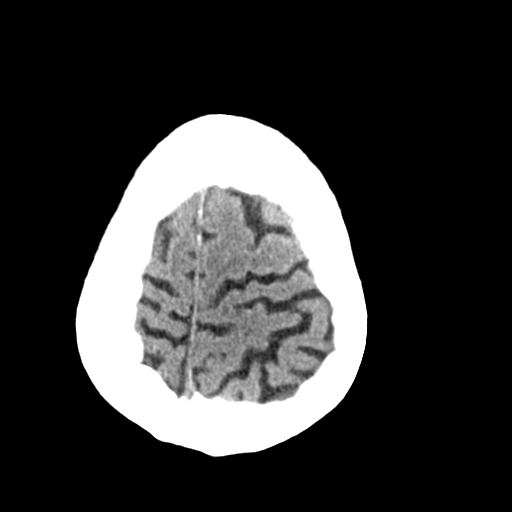
[im 27/29  brain]
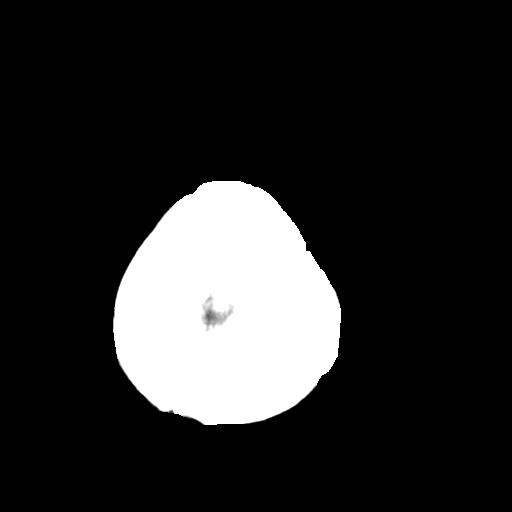
[im 27/29  bone]
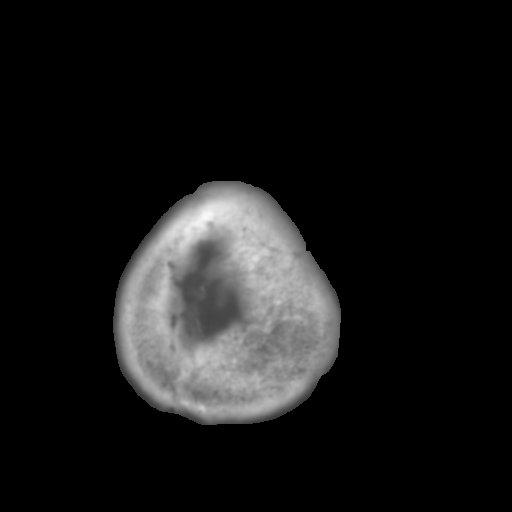

[Series 4: coronal soft tissue · coronal · 0.35mm/px · 3 of 73 slices shown]
[im 25/73  brain]
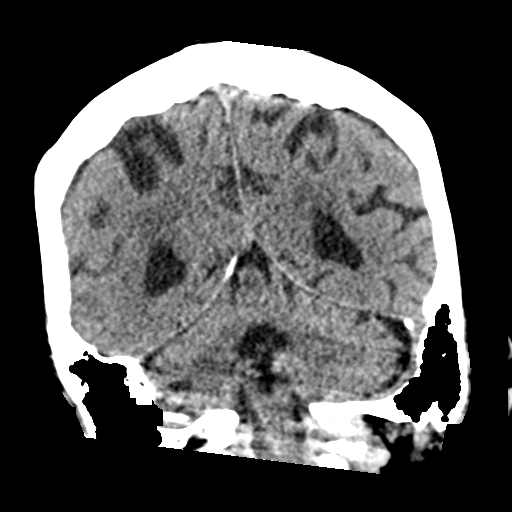
[im 33/73  brain]
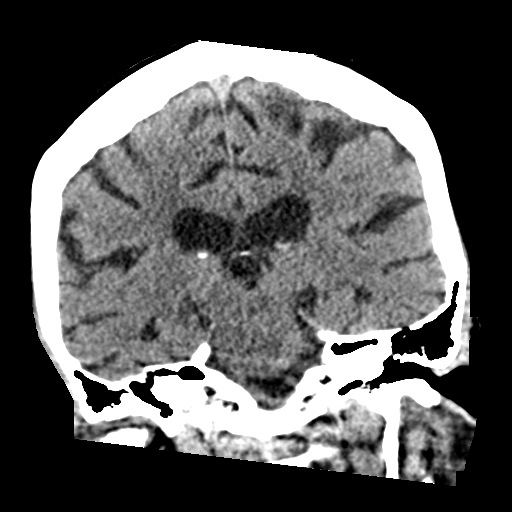
[im 41/73  brain]
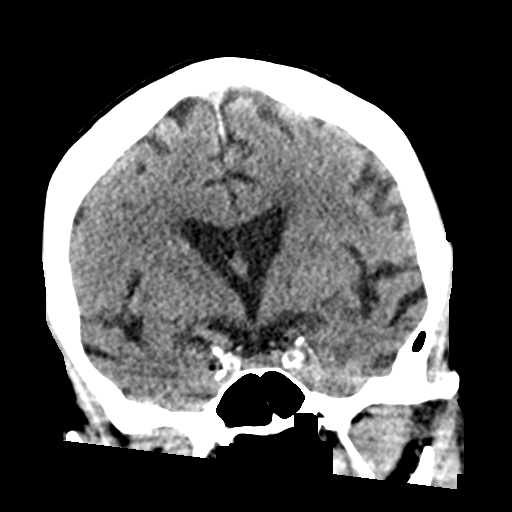

[Series 5: sagittal soft tissue · sagittal · 0.32mm/px · 3 of 67 slices shown]
[im 26/67  brain]
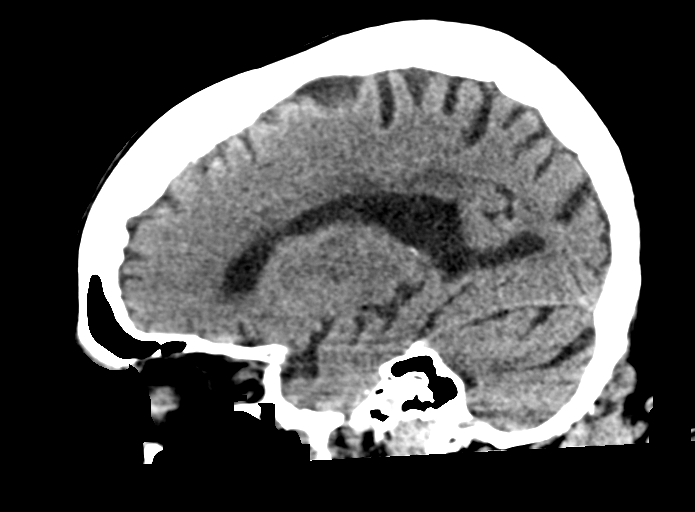
[im 34/67  brain]
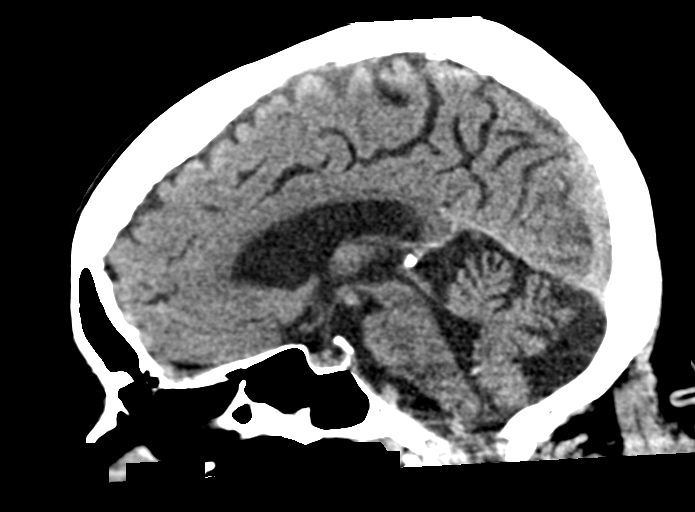
[im 41/67  brain]
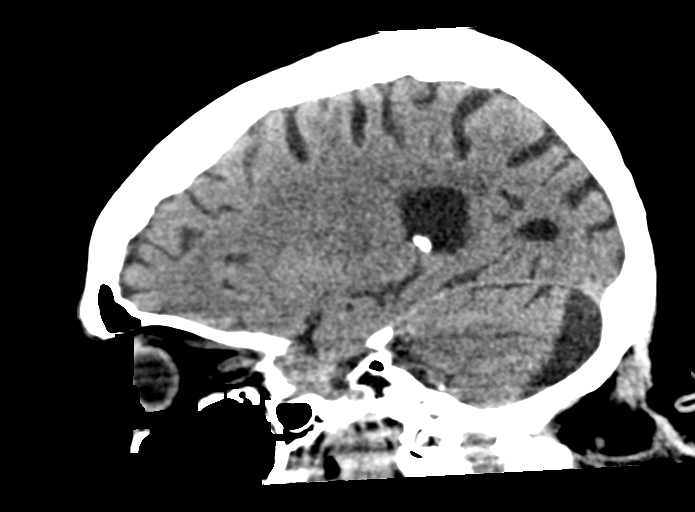

[15 of 46 positions shown; findings below may reference images not displayed]

FINDINGS: Brain: There is mild age-related atrophy and chronic microvascular
ischemic changes. There is no acute intracranial hemorrhage. No mass
effect or midline shift noted. No extra-axial fluid collection.
There is prominence of the posterior fossa CSF space which may
represent a mega cisterna magna versus an arachnoid cyst. MRI may
provide better evaluation.

Vascular: No hyperdense vessel or unexpected calcification.

Skull: Normal. Negative for fracture or focal lesion.

Sinuses/Orbits: No acute finding.

Other: None
IMPRESSION: 1. No acute intracranial hemorrhage.
2. Age-related atrophy and chronic microvascular ischemic changes.
If symptoms persist, and there are no contraindications, MRI may
provide better evaluation if clinically indicated.
3. Dilated cisterna magna versus an arachnoid cyst in the posterior
fossa.
